# Patient Record
Sex: Female | Born: 1935 | Race: Black or African American | Hispanic: No | Marital: Married | Smoking: Never smoker
Health system: Southern US, Community
[De-identification: ages and names within clinical notes are randomized; demographics above are authoritative.]

## PROBLEM LIST (undated history)

## (undated) DIAGNOSIS — D649 Anemia, unspecified: Secondary | ICD-10-CM

## (undated) DIAGNOSIS — I509 Heart failure, unspecified: Secondary | ICD-10-CM

## (undated) DIAGNOSIS — I350 Nonrheumatic aortic (valve) stenosis: Secondary | ICD-10-CM

## (undated) DIAGNOSIS — N200 Calculus of kidney: Secondary | ICD-10-CM

## (undated) DIAGNOSIS — E876 Hypokalemia: Secondary | ICD-10-CM

## (undated) HISTORY — DX: Nonrheumatic aortic (valve) stenosis: I35.0

## (undated) HISTORY — DX: Calculus of kidney: N20.0

## (undated) HISTORY — DX: Hypokalemia: E87.6

## (undated) HISTORY — DX: Anemia, unspecified: D64.9

## (undated) HISTORY — DX: Heart failure, unspecified: I50.9

---

## 2017-08-21 ENCOUNTER — Ambulatory Visit
Admission: RE | Admit: 2017-08-21 | Discharge: 2017-08-21 | Disposition: A | Discharge status: Home / Self Care | Payer: Medicare Other | Source: Ambulatory Visit | Attending: Internal Medicine | Admitting: Internal Medicine

## 2017-08-21 ENCOUNTER — Other Ambulatory Visit: Payer: Self-pay | Admitting: Internal Medicine

## 2017-08-21 DIAGNOSIS — R0600 Dyspnea, unspecified: Secondary | ICD-10-CM

## 2017-08-21 DIAGNOSIS — J90 Pleural effusion, not elsewhere classified: Secondary | ICD-10-CM

## 2017-08-22 ENCOUNTER — Emergency Department: Payer: Medicare Other

## 2017-08-22 ENCOUNTER — Inpatient Hospital Stay
Admission: EM | Admit: 2017-08-22 | Discharge: 2017-08-26 | DRG: 292 | Disposition: A | Discharge status: Home Health | Payer: Medicare Other | Attending: Specialist | Admitting: Specialist

## 2017-08-22 ENCOUNTER — Other Ambulatory Visit: Payer: Self-pay

## 2017-08-22 DIAGNOSIS — I509 Heart failure, unspecified: Secondary | ICD-10-CM | POA: Diagnosis not present

## 2017-08-22 DIAGNOSIS — E876 Hypokalemia: Secondary | ICD-10-CM | POA: Diagnosis present

## 2017-08-22 DIAGNOSIS — I248 Other forms of acute ischemic heart disease: Secondary | ICD-10-CM | POA: Diagnosis present

## 2017-08-22 DIAGNOSIS — H9190 Unspecified hearing loss, unspecified ear: Secondary | ICD-10-CM | POA: Diagnosis present

## 2017-08-22 DIAGNOSIS — I493 Ventricular premature depolarization: Secondary | ICD-10-CM | POA: Diagnosis present

## 2017-08-22 DIAGNOSIS — Z66 Do not resuscitate: Secondary | ICD-10-CM | POA: Diagnosis present

## 2017-08-22 DIAGNOSIS — I959 Hypotension, unspecified: Secondary | ICD-10-CM | POA: Diagnosis present

## 2017-08-22 DIAGNOSIS — I350 Nonrheumatic aortic (valve) stenosis: Secondary | ICD-10-CM | POA: Diagnosis present

## 2017-08-22 DIAGNOSIS — E440 Moderate protein-calorie malnutrition: Secondary | ICD-10-CM | POA: Diagnosis present

## 2017-08-22 DIAGNOSIS — R17 Unspecified jaundice: Secondary | ICD-10-CM | POA: Diagnosis present

## 2017-08-22 DIAGNOSIS — R2681 Unsteadiness on feet: Secondary | ICD-10-CM | POA: Diagnosis present

## 2017-08-22 DIAGNOSIS — I5043 Acute on chronic combined systolic (congestive) and diastolic (congestive) heart failure: Secondary | ICD-10-CM | POA: Diagnosis not present

## 2017-08-22 DIAGNOSIS — I5031 Acute diastolic (congestive) heart failure: Secondary | ICD-10-CM | POA: Diagnosis not present

## 2017-08-22 DIAGNOSIS — R011 Cardiac murmur, unspecified: Secondary | ICD-10-CM | POA: Diagnosis present

## 2017-08-22 DIAGNOSIS — I471 Supraventricular tachycardia, unspecified: Secondary | ICD-10-CM

## 2017-08-22 DIAGNOSIS — Z8249 Family history of ischemic heart disease and other diseases of the circulatory system: Secondary | ICD-10-CM

## 2017-08-22 DIAGNOSIS — Z6824 Body mass index (BMI) 24.0-24.9, adult: Secondary | ICD-10-CM

## 2017-08-22 DIAGNOSIS — Z9181 History of falling: Secondary | ICD-10-CM | POA: Diagnosis not present

## 2017-08-22 DIAGNOSIS — I5020 Unspecified systolic (congestive) heart failure: Secondary | ICD-10-CM

## 2017-08-22 DIAGNOSIS — R0600 Dyspnea, unspecified: Secondary | ICD-10-CM | POA: Diagnosis present

## 2017-08-22 DIAGNOSIS — D649 Anemia, unspecified: Secondary | ICD-10-CM

## 2017-08-22 DIAGNOSIS — J90 Pleural effusion, not elsewhere classified: Secondary | ICD-10-CM | POA: Diagnosis present

## 2017-08-22 DIAGNOSIS — I5021 Acute systolic (congestive) heart failure: Secondary | ICD-10-CM | POA: Diagnosis not present

## 2017-08-22 DIAGNOSIS — D638 Anemia in other chronic diseases classified elsewhere: Secondary | ICD-10-CM | POA: Diagnosis present

## 2017-08-22 LAB — HEPATIC FUNCTION PANEL
ALT: 11 U/L — ABNORMAL LOW (ref 14–54)
AST: 29 U/L (ref 15–41)
Albumin: 3.4 g/dL — ABNORMAL LOW (ref 3.5–5.0)
Alkaline Phosphatase: 49 U/L (ref 38–126)
BILIRUBIN INDIRECT: 2 mg/dL — AB (ref 0.3–0.9)
BILIRUBIN TOTAL: 2.7 mg/dL — AB (ref 0.3–1.2)
Bilirubin, Direct: 0.7 mg/dL — ABNORMAL HIGH (ref 0.1–0.5)
Total Protein: 6 g/dL — ABNORMAL LOW (ref 6.5–8.1)

## 2017-08-22 LAB — BASIC METABOLIC PANEL
Anion gap: 8 (ref 5–15)
BUN: 19 mg/dL (ref 6–20)
CALCIUM: 8.9 mg/dL (ref 8.9–10.3)
CO2: 23 mmol/L (ref 22–32)
CREATININE: 0.79 mg/dL (ref 0.44–1.00)
Chloride: 113 mmol/L — ABNORMAL HIGH (ref 101–111)
GFR calc non Af Amer: >60 mL/min (ref >60)
GLUCOSE: 115 mg/dL — AB (ref 65–99)
Potassium: 3.7 mmol/L (ref 3.5–5.1)
Sodium: 144 mmol/L (ref 135–145)

## 2017-08-22 LAB — CBC
HCT: 27.4 % — ABNORMAL LOW (ref 35.0–47.0)
Hemoglobin: 8.9 g/dL — ABNORMAL LOW (ref 12.0–16.0)
MCH: 30.6 pg (ref 26.0–34.0)
MCHC: 32.3 g/dL (ref 32.0–36.0)
MCV: 94.7 fL (ref 80.0–100.0)
PLATELETS: 244 10*3/uL (ref 150–440)
RBC: 2.89 MIL/uL — ABNORMAL LOW (ref 3.80–5.20)
RDW: 21 % — ABNORMAL HIGH (ref 11.5–14.5)
WBC: 3.7 10*3/uL (ref 3.6–11.0)

## 2017-08-22 LAB — TSH: TSH: 3.899 u[IU]/mL (ref 0.350–4.500)

## 2017-08-22 LAB — TROPONIN I: TROPONIN I: 0.03 ng/mL — AB (ref <0.03)

## 2017-08-22 LAB — BRAIN NATRIURETIC PEPTIDE: B NATRIURETIC PEPTIDE 5: 3354 pg/mL — AB (ref 0.0–100.0)

## 2017-08-22 MED ORDER — SODIUM CHLORIDE 0.9% FLUSH: 3.0000 mL | INTRAVENOUS | Status: DC | PRN

## 2017-08-22 MED ORDER — SODIUM CHLORIDE 0.9% FLUSH
3.0000 mL | Freq: Two times a day (BID) | INTRAVENOUS | Status: DC
  Administered 2017-08-22 – 2017-08-26 (×9): 3 mL via INTRAVENOUS

## 2017-08-22 MED ORDER — FUROSEMIDE 10 MG/ML IJ SOLN
20.0000 mg | Freq: Once | INTRAMUSCULAR | Status: AC
  Administered 2017-08-22: 20 mg via INTRAVENOUS
  Filled 2017-08-22: qty 4

## 2017-08-22 MED ORDER — ACETAMINOPHEN 325 MG PO TABS
650.0000 mg | ORAL_TABLET | Freq: Four times a day (QID) | ORAL | Status: DC | PRN
  Administered 2017-08-24 – 2017-08-25 (×2): 650 mg via ORAL
  Filled 2017-08-22 (×2): qty 2

## 2017-08-22 MED ORDER — ONDANSETRON HCL 4 MG/2ML IJ SOLN: 4.0000 mg | Freq: Four times a day (QID) | INTRAMUSCULAR | Status: DC | PRN

## 2017-08-22 MED ORDER — ONDANSETRON HCL 4 MG PO TABS: 4.0000 mg | ORAL_TABLET | Freq: Four times a day (QID) | ORAL | Status: DC | PRN

## 2017-08-22 MED ORDER — FUROSEMIDE 10 MG/ML IJ SOLN: 40.0000 mg | Freq: Two times a day (BID) | INTRAMUSCULAR | Status: DC

## 2017-08-22 MED ORDER — FUROSEMIDE 10 MG/ML IJ SOLN
40.0000 mg | Freq: Two times a day (BID) | INTRAMUSCULAR | Status: DC
  Administered 2017-08-22 – 2017-08-25 (×6): 40 mg via INTRAVENOUS
  Filled 2017-08-22 (×5): qty 4

## 2017-08-22 MED ORDER — ENOXAPARIN SODIUM 40 MG/0.4ML ~~LOC~~ SOLN
40.0000 mg | SUBCUTANEOUS | Status: DC
  Administered 2017-08-22 – 2017-08-25 (×3): 40 mg via SUBCUTANEOUS
  Filled 2017-08-22 (×3): qty 0.4

## 2017-08-22 MED ORDER — ACETAMINOPHEN 650 MG RE SUPP: 650.0000 mg | Freq: Four times a day (QID) | RECTAL | Status: DC | PRN

## 2017-08-22 NOTE — Consult Note (Addendum)
Cardiology Consultation:   Patient ID: Carolyn Clarke; [id number]; [birth year]   Admit date: 08/22/2017 Date of Consult: 08/22/2017  Primary Care Provider: Jaclyn Shaggyate, Denny C, MD Primary Cardiologist[facility] Consult requested by Dr. Nemiah CommanderKalisetti  reason for consult: CHF, leg edema, shortness of breath    Patient Profile:   Carolyn Clarke is a 84 y.o. female with a hx of anemia, no doctor follow-up in 30 years, presenting with worsening shortness of breath, leg edema, general malaise, weakness, cough  History of Present Illness:   Carolyn Clarke reports several weeks of weakness, shortness of breath on exertion, coughing,worsening lower extremity edema extending up to her abdomen  Micah FlesherWent to see primary care physician who referred her to the hospital Chest x-ray reviewed personally by myself showing bilateral pleural effusions with pulmonary vascular congestion  Lab work reviewed with anemia   History reviewed. No pertinent past medical history.  Reports she has not seen a doctor in 30 years  History reviewed. No pertinent surgical history.   Home Medications:  Prior to Admission medications   Not on File    Inpatient Medications: Scheduled Meds: . enoxaparin (LOVENOX) injection  40 mg Subcutaneous Q24H  . furosemide  40 mg Intravenous BID  . sodium chloride flush  3 mL Intravenous Q12H   Continuous Infusions:  PRN Meds: acetaminophen **OR** acetaminophen, ondansetron **OR** ondansetron (ZOFRAN) IV, sodium chloride flush  Allergies:   No Known Allergies  Social History:   Social History   Socioeconomic History  . Marital status: Married    Spouse name: Not on file  . Number of children: Not on file  . Years of education: Not on file  . Highest education level: Not on file  Occupational History  . Not on file  Social Needs  . Financial resource strain: Not on file  . Food insecurity:    Worry: Not on file    Inability: Not on file  . Transportation needs:   Medical: Not on file    Non-medical: Not on file  Tobacco Use  . Smoking status: Never Smoker  . Smokeless tobacco: Never Used  Substance and Sexual Activity  . Alcohol use: Never    Frequency: Never  . Drug use: Never  . Sexual activity: Not Currently  Lifestyle  . Physical activity:    Days per week: Not on file    Minutes per session: Not on file  . Stress: Not on file  Relationships  . Social connections:    Talks on phone: Not on file    Gets together: Not on file    Attends religious service: Not on file    Active member of club or organization: Not on file    Attends meetings of clubs or organizations: Not on file    Relationship status: Not on file  . Intimate partner violence:    Fear of current or ex partner: Not on file    Emotionally abused: Not on file    Physically abused: Not on file    Forced sexual activity: Not on file  Other Topics Concern  . Not on file  Social History Narrative   Lives at home with husband. Ambulates with a cane. Independent at baseline    Family History:    Family History  Problem Relation Age of Onset  . Heart failure Mother   . Hypertension Father      ROS:  Please see the history of present illness.  Review of Systems  Constitutional: Positive for malaise/fatigue.  Respiratory: Positive for cough and shortness of breath.   Cardiovascular: Positive for leg swelling.  Gastrointestinal: Negative.   Musculoskeletal: Negative.   Neurological: Negative.   Psychiatric/Behavioral: Negative.   All other systems reviewed and are negative.   Physical Exam/Data:   Vitals:   08/22/17 1200 08/22/17 1215 08/22/17 1400 08/22/17 1525  BP: 132/69   (!) 149/58  Pulse: 71 65 72 68  Resp: 18 19  20   Temp:    (!) 97.4 F (36.3 C)  TempSrc:    (P) Axillary  SpO2: 97% 100% 98% 100%  Weight:    153 lb (69.4 kg)  Height:    5\' 6"  (1.676 m)    Intake/Output Summary (Last 24 hours) at 08/22/2017 1924 Last data filed at 08/22/2017  1850 Gross per 24 hour  Intake 240 ml  Output 150 ml  Net 90 ml   Filed Weights   08/22/17 1035 08/22/17 1525  Weight: 162 lb (73.5 kg) 153 lb (69.4 kg)   Body mass index is 24.69 kg/m.  General:  Well nourished, well developed, in no acute distress, walks with a cane HEENT: normal Lymph: no adenopathy Neck: no JVD Endocrine:  No thryomegaly Vascular: No carotid bruits; FA pulses 2+ bilaterally without bruits  Cardiac:  normal S1, S2; RRR; no murmur  Lungs:  clear to auscultation bilaterally, dullness at the bases bilaterally,  no wheezing, rhonchi or rales  Abd: soft, nontender, no hepatomegaly  Ext: 1+ LE edema  Musculoskeletal:  No deformities, BUE and BLE strength normal and equal Skin: warm and dry  Neuro:  CNs 2-12 intact, no focal abnormalities noted Psych:  Normal affect   EKG:  The EKG was personally reviewed and demonstrates:   EKG not currently available We have contacted the emergency room to locate the EKG, not scanned in our system  Telemetry:  Telemetry was personally reviewed and demonstrates:   Normal sinus rhythm PVCs,  One run 12 beat SVT 6:56 PM  Relevant CV Studies: Echo pending  Laboratory Data:  Chemistry Recent Labs  Lab 08/22/17 1104  NA 144  K 3.7  CL 113*  CO2 23  GLUCOSE 115*  BUN 19  CREATININE 0.79  CALCIUM 8.9  GFRNONAA >60  GFRAA >60  ANIONGAP 8    Recent Labs  Lab 08/22/17 1307  PROT 6.0*  ALBUMIN 3.4*  AST 29  ALT 11*  ALKPHOS 49  BILITOT 2.7*   Hematology Recent Labs  Lab 08/22/17 1104  WBC 3.7  RBC 2.89*  HGB 8.9*  HCT 27.4*  MCV 94.7  MCH 30.6  MCHC 32.3  RDW 21.0*  PLT 244   Cardiac Enzymes Recent Labs  Lab 08/22/17 1104  TROPONINI 0.03*   No results for input(s): TROPIPOC in the last 168 hours.  BNP Recent Labs  Lab 08/22/17 1307  BNP 3,354.0*    DDimer No results for input(s): DDIMER in the last 168 hours.  Radiology/Studies:  Dg Chest 2 View  Result Date: 08/22/2017 CLINICAL DATA:   Shortness of breath and lower extremity swelling. EXAM: CHEST - 2 VIEW COMPARISON:  Chest x-ray from yesterday. FINDINGS: The cardiac silhouette is obscured. Pulmonary vascular congestion with moderate right greater than left pleural effusions, unchanged. Associated bibasilar atelectasis. No pneumothorax. No acute osseous abnormality. IMPRESSION: Unchanged moderate bilateral pleural effusions with lower lobe atelectasis. Electronically Signed   By: Obie Dredge M.D.   On: 08/22/2017 11:37   Dg Chest 2 View  Result Date: 08/21/2017 CLINICAL DATA:  Shortness of  breath. EXAM: CHEST - 2 VIEW COMPARISON:  None. FINDINGS: Large bilateral pleural effusions are noted with probable underlying atelectasis or edema. No pneumothorax is noted. Bony thorax is unremarkable. IMPRESSION: Large bilateral pleural effusions are noted with probable underlying atelectasis or edema. Electronically Signed   By: Lupita Raider, M.D.   On: 08/21/2017 16:26    Assessment and Plan:   1. CHF with symptoms of shortness of breath, leg swelling Echocardiogram pending to determine ejection fraction Symptoms for the past several weeks Markedly elevated BNP 3300 Leg swelling, weight gain, orthopnea and PND, shortness of breath Likely exacerbated by anemia, aortic valve stenosis --Currently on Lasix IV twice daily  2) anemia Hematocrit 27 Needs anemia work-up, iron studies Likely contributing to her symptoms detailed above Consider stool guaiac  3) unsteady gait Walks with a cane, fall risk  4) weakness In the setting of 1 and 2 as detailed above  5) elevated bilirubin Etiology unclear, consider right upper quadrant ultrasound  6) hypokalemia Would aggressively replete  7) SVT We will need to monitor Telemetry closely 12 beat run noted at 6:56 PM Also with PVCs  8) Aortic valve stenosis Likely severe , on exam Echo pending May need diuresis,  If severe, cath, TAVR    Total encounter time more than  110 minutes  Greater than 50% was spent in counseling and coordination of care with the patient   For questions or updates, please contact [facility] Please consult [email or web address] for contact info under Cardiology/STEMI.   Signed, Julien Nordmann, MD  08/22/2017 7:24 PM

## 2017-08-22 NOTE — H&P (Signed)
[facility] [place] [facility]   PATIENT NAME: Carolyn Clarke    MR#:  [id number]  DATE OF BIRTH:  Mar 15, 1927  DATE OF ADMISSION:  08/22/2017  PRIMARY CARE PHYSICIAN: Jaclyn Shaggyate, Denny C, MD   REQUESTING/REFERRING PHYSICIAN: Dr. Dorothea GlassmanPaul Malinda  CHIEF COMPLAINT:   Chief Complaint  Patient presents with  . Shortness of Breath    HISTORY OF PRESENT ILLNESS:  Carolyn CatholicKairo Mcmahen  is a 84 y.o. female with no significant past medical history presents to hospital secondary to worsening lower extremity edema and shortness of breath. Patient states she has not seen a doctor in almost 30 years.  She usually uses home remedies for simple complaints like common cold or abdominal pain.  She is independent, ambulates with a cane and has been caring for her husband who is [id number] years old.  For the last couple of weeks she has noticed that she was getting weak, easily dyspneic on minimal exertion and dry cough.  She has noticed increasing swelling of both her legs all the way to her abdomen.  She went to see her primary care doctor today, who has referred her to come to the hospital.  Chest x-ray with bilateral pleural effusions and pulmonary vascular congestion.  PAST MEDICAL HISTORY:  History reviewed. No pertinent past medical history.  PAST SURGICAL HISTORY:  History reviewed. No pertinent surgical history.  SOCIAL HISTORY:   Social History   Tobacco Use  . Smoking status: Never Smoker  . Smokeless tobacco: Never Used  Substance Use Topics  . Alcohol use: Never    Frequency: Never    FAMILY HISTORY:   Family History  Problem Relation Age of Onset  . Heart failure Mother   . Hypertension Father     DRUG ALLERGIES:  No Known Allergies  REVIEW OF SYSTEMS:   Review of Systems  Constitutional: Positive for malaise/fatigue. Negative for chills, fever and weight loss.  HENT: Negative for ear discharge, ear pain, hearing loss, nosebleeds and tinnitus.   Eyes: Negative  for blurred vision, double vision and photophobia.  Respiratory: Positive for cough and shortness of breath. Negative for hemoptysis and wheezing.   Cardiovascular: Positive for leg swelling. Negative for chest pain, palpitations and orthopnea.  Gastrointestinal: Negative for abdominal pain, constipation, diarrhea, heartburn, melena, nausea and vomiting.  Genitourinary: Negative for dysuria, frequency, hematuria and urgency.  Musculoskeletal: Negative for back pain, myalgias and neck pain.  Skin: Negative for rash.  Neurological: Negative for dizziness, tingling, tremors, sensory change, speech change, focal weakness and headaches.  Endo/Heme/Allergies: Does not bruise/bleed easily.  Psychiatric/Behavioral: Negative for depression.    MEDICATIONS AT HOME:   Prior to Admission medications   Not on File      VITAL SIGNS:  Blood pressure 132/69, pulse 72, temperature 97.7 F (36.5 C), temperature source Oral, resp. rate 19, height 5\' 2"  (1.575 m), weight 73.5 kg (162 lb), SpO2 98 %.  PHYSICAL EXAMINATION:   Physical Exam  GENERAL:  84 y.o.-year-old elderly patient lying in the bed with no acute distress.  EYES: Pupils equal, round, reactive to light and accommodation. No scleral icterus. Extraocular muscles intact.  HEENT: Head atraumatic, normocephalic. Oropharynx and nasopharynx clear.  NECK:  Supple, no jugular venous distention. No thyroid enlargement, no tenderness.  LUNGS: Normal breath sounds bilaterally, no wheezing, rales,rhonchi or crepitation. No use of accessory muscles of respiration.  Decreased bibasilar breath sounds CARDIOVASCULAR: S1, S2 normal. No  rubs, or gallops.  Loud 3/6 systolic murmur is present  ABDOMEN: Soft, nontender, nondistended. Bowel sounds present. No organomegaly or mass.  EXTREMITIES: 3+ bilateral pitting edema.  No cyanosis, or clubbing.  NEUROLOGIC: Cranial nerves II through XII are intact. Muscle strength 5/5 in all extremities. Sensation intact.  Gait not checked.  Global weakness noted PSYCHIATRIC: The patient is alert and oriented x 3.  SKIN: No obvious rash, lesion, or ulcer.   LABORATORY PANEL:   CBC Recent Labs  Lab 08/22/17 1104  WBC 3.7  HGB 8.9*  HCT 27.4*  PLT 244   ------------------------------------------------------------------------------------------------------------------  Chemistries  Recent Labs  Lab 08/22/17 1104 08/22/17 1307  NA 144  --   K 3.7  --   CL 113*  --   CO2 23  --   GLUCOSE 115*  --   BUN 19  --   CREATININE 0.79  --   CALCIUM 8.9  --   AST  --  29  ALT  --  11*  ALKPHOS  --  49  BILITOT  --  2.7*   ------------------------------------------------------------------------------------------------------------------  Cardiac Enzymes Recent Labs  Lab 08/22/17 1104  TROPONINI 0.03*   ------------------------------------------------------------------------------------------------------------------  RADIOLOGY:  Dg Chest 2 View  Result Date: 08/22/2017 CLINICAL DATA:  Shortness of breath and lower extremity swelling. EXAM: CHEST - 2 VIEW COMPARISON:  Chest x-ray from yesterday. FINDINGS: The cardiac silhouette is obscured. Pulmonary vascular congestion with moderate right greater than left pleural effusions, unchanged. Associated bibasilar atelectasis. No pneumothorax. No acute osseous abnormality. IMPRESSION: Unchanged moderate bilateral pleural effusions with lower lobe atelectasis. Electronically Signed   By: Obie Dredge M.D.   On: 08/22/2017 11:37   Dg Chest 2 View  Result Date: 08/21/2017 CLINICAL DATA:  Shortness of breath. EXAM: CHEST - 2 VIEW COMPARISON:  None. FINDINGS: Large bilateral pleural effusions are noted with probable underlying atelectasis or edema. No pneumothorax is noted. Bony thorax is unremarkable. IMPRESSION: Large bilateral pleural effusions are noted with probable underlying atelectasis or edema. Electronically Signed   By: Lupita Raider, M.D.   On:  08/21/2017 16:26    EKG:   Orders placed or performed during the hospital encounter of 08/22/17  . ED EKG within 10 minutes  . ED EKG within 10 minutes    IMPRESSION AND PLAN:   Torina Ey  is a 84 y.o. female with no significant past medical history presents to hospital secondary to worsening lower extremity edema and shortness of breath.  1.  Acute CHF-we will admit to telemetry, significantly elevated BNP -Started on IV Lasix -Echocardiogram, cardiology consult -Based on blood pressure and ejection fraction, will adjust other medications.  2.  Anemia-likely anemia of chronic disease.  Continue to monitor.  No acute indication for transfusion  3.  Weakness-could be from CHF and cardiomyopathy.  Check echo -Check urine analysis to rule out infection -Physical therapy consult  4.  DVT prophylaxis-Lovenox   All the records are reviewed and case discussed with ED provider. Management plans discussed with the patient, family and they are in agreement.  CODE STATUS: DNR  TOTAL TIME TAKING CARE OF THIS PATIENT: 50 minutes.    Enid Baas M.D on 08/22/2017 at 3:00 PM  Between 7am to 6pm - Pager - [phone]  After 6pm go to [email or web address] - Social research officer, government  [facility]  Office  [phone]  CC: Primary care physician; Jaclyn Shaggy, MD

## 2017-08-22 NOTE — Progress Notes (Signed)
   [facility] [place] [facility]   Advance care planning  Hospital Day: 0 days Malena CatholicKairo Starling is a 84 y.o. female presenting with Shortness of Breath .   Advance care planning discussed with patient and her god daughter at bedside. All questions in regards to overall condition and expected prognosis answered.  Patient is completely alert and oriented.  She has clearly expressed that if her heart were to stop, she wants to Pass away naturally.  We will change her CODE STATUS to DNR.  CODE STATUS: DNR  Time spent: 18 minutes

## 2017-08-22 NOTE — ED Provider Notes (Signed)
[facility] Emergency Department Provider Note   ____________________________________________   First MD Initiated Contact with Patient 08/22/17 1132     (approximate)  I have reviewed the triage vital signs and the nursing notes.   HISTORY  Chief Complaint Shortness of Breath   HPI Carolyn Clarke is a 84 y.o. female patient has not been to see the doctor for years went to see Dr. Arlana Pouchate yesterday was called back today and sent here because of heart failure.  She says she is been short of breath.  She is been having bilateral leg swelling for the last 2 to 3 months.  She denies any other medical problems except for swelling in her abdomen as well.   History reviewed. No pertinent past medical history.  There are no active problems to display for this patient.   History reviewed. No pertinent surgical history.  Prior to Admission medications   Not on File    Allergies Patient has no known allergies.  No family history on file.  Social History Social History   Tobacco Use  . Smoking status: Never Smoker  . Smokeless tobacco: Never Used  Substance Use Topics  . Alcohol use: Never    Frequency: Never  . Drug use: Never    Review of Systems  Constitutional: No fever/chills Eyes: No visual changes. ENT: No sore throat. Cardiovascular: Denies chest pain. Respiratory:  shortness of breath. Gastrointestinal: No abdominal pain.  No nausea, no vomiting.  No diarrhea.  No constipation. Genitourinary: Negative for dysuria. Musculoskeletal: Negative for back pain. Skin: Negative for rash. Neurological: Negative for headaches, focal weakness or   ____________________________________________   PHYSICAL EXAM:  VITAL SIGNS: ED Triage Vitals [08/22/17 1035]  Enc Vitals Group     BP (!) 123/55     Pulse Rate 73     Resp 16     Temp 97.7 F (36.5 C)     Temp Source Oral     SpO2 100 %     Weight 162 lb (73.5 kg)     Height 5\' 2"  (1.575  m)     Head Circumference      Peak Flow      Pain Score 0     Pain Loc      Pain Edu?      Excl. in GC?     Constitutional: Alert and oriented. Well appearing and in no acute distress. Eyes: Conjunctivae are normal.  Head: Atraumatic. Nose: No congestion/rhinnorhea. Mouth/Throat: Mucous membranes are moist.  Oropharynx non-erythematous. Neck: No stridor.  Cardiovascular: Normal rate, regular rhythm. Grossly normal heart sounds.  Good peripheral circulation. Respiratory: Normal respiratory effort.  No retractions. Lungs CTAB in upper lung feels fairly quiet in the lower lung fields. Gastrointestinal: Soft and nontender. No distention. No abdominal bruits. No CVA tenderness. Musculoskeletal: No lower extremity tenderness edema to above the knees edema.   Neurologic:  Normal speech and language. No gross focal neurologic deficits are appreciated.  Skin:  Skin is warm, dry and intact. No rash noted. Psychiatric: Mood and affect are normal. Speech and behavior are normal.  ____________________________________________   LABS (all labs ordered are listed, but only abnormal results are displayed)  Labs Reviewed  BASIC METABOLIC PANEL - Abnormal; Notable for the following components:      Result Value   Chloride 113 (*)    Glucose, Bld 115 (*)    All other components within normal limits  CBC - Abnormal; Notable for the following components:  RBC 2.89 (*)    Hemoglobin 8.9 (*)    HCT 27.4 (*)    RDW 21.0 (*)    All other components within normal limits  TROPONIN I - Abnormal; Notable for the following components:   Troponin I 0.03 (*)    All other components within normal limits  BRAIN NATRIURETIC PEPTIDE - Abnormal; Notable for the following components:   B Natriuretic Peptide 3,354.0 (*)    All other components within normal limits  HEPATIC FUNCTION PANEL - Abnormal; Notable for the following components:   Total Protein 6.0 (*)    Albumin 3.4 (*)    ALT 11 (*)    Total  Bilirubin 2.7 (*)    Bilirubin, Direct 0.7 (*)    Indirect Bilirubin 2.0 (*)    All other components within normal limits   ____________________________________________  EKG   ____________________________________________  RADIOLOGY  ED MD interpretation: Bilateral effusions and increased vascular markings in the lungs consistent with congestive failure reviewed the film  Official radiology report(s): Dg Chest 2 View  Result Date: 08/22/2017 CLINICAL DATA:  Shortness of breath and lower extremity swelling. EXAM: CHEST - 2 VIEW COMPARISON:  Chest x-ray from yesterday. FINDINGS: The cardiac silhouette is obscured. Pulmonary vascular congestion with moderate right greater than left pleural effusions, unchanged. Associated bibasilar atelectasis. No pneumothorax. No acute osseous abnormality. IMPRESSION: Unchanged moderate bilateral pleural effusions with lower lobe atelectasis. Electronically Signed   By: Obie Dredge M.D.   On: 08/22/2017 11:37    ____________________________________________   PROCEDURES  Procedure(s) performed:   Procedures  Critical Care performed:   ____________________________________________   INITIAL IMPRESSION / ASSESSMENT AND PLAN / ED COURSE Patient with new onset congestive heart failure.  She has large effusions gets short of breath when she walks although her sats only dropped down to 91%.  Troponin is bumped as well.  I will see if we can get her in the hospital to expedite her evaluation and treatment since it is new onset congestive heart failure     Clinical Course as of Aug 22 1344  Thu Aug 22, 2017  1306 Brain natriuretic peptide [PM]    Clinical Course User Index [PM] Arnaldo Natal, MD     ____________________________________________   FINAL CLINICAL IMPRESSION(S) / ED DIAGNOSES  Final diagnoses:  Systolic congestive heart failure, unspecified HF chronicity [facility])     ED Discharge Orders    None       Note:  This  document was prepared using Dragon voice recognition software and may include unintentional dictation errors.    Arnaldo Natal, MD 08/22/17 [phone]

## 2017-08-22 NOTE — ED Triage Notes (Addendum)
Pt states she was seen by her PCP Dr. Ala Bentate  Yesterday and was called back today and referred to the ED due to heart failure. States she has been SOB. Denies any pain, having BL LE swelling.. States she has not been to see a doctor since 1988

## 2017-08-22 NOTE — ED Notes (Signed)
CNA students ambulated pt. Pt was able to walk with her cane and dropped to 91% on RA with ambulation.

## 2017-08-22 NOTE — ED Notes (Signed)
Admitting doctor at bedside. Consult placed of PIV insert due to pt having 3 attempts for blood draw in triage and then two PIV attempts by this nurse.

## 2017-08-23 ENCOUNTER — Inpatient Hospital Stay (HOSPITAL_COMMUNITY)
Admit: 2017-08-23 | Discharge: 2017-08-23 | Disposition: A | Discharge status: Home / Self Care | Payer: Medicare Other | Attending: Internal Medicine | Admitting: Internal Medicine

## 2017-08-23 DIAGNOSIS — E440 Moderate protein-calorie malnutrition: Secondary | ICD-10-CM

## 2017-08-23 DIAGNOSIS — I5021 Acute systolic (congestive) heart failure: Secondary | ICD-10-CM

## 2017-08-23 LAB — BASIC METABOLIC PANEL
Anion gap: 6 (ref 5–15)
BUN: 19 mg/dL (ref 6–20)
CO2: 28 mmol/L (ref 22–32)
Calcium: 8.6 mg/dL — ABNORMAL LOW (ref 8.9–10.3)
Chloride: 108 mmol/L (ref 101–111)
Creatinine, Ser: 0.84 mg/dL (ref 0.44–1.00)
GFR calc Af Amer: >60 mL/min (ref >60)
GFR, EST NON AFRICAN AMERICAN: 60 mL/min — AB (ref >60)
GLUCOSE: 137 mg/dL — AB (ref 65–99)
POTASSIUM: 3.3 mmol/L — AB (ref 3.5–5.1)
Sodium: 142 mmol/L (ref 135–145)

## 2017-08-23 LAB — CBC
HEMATOCRIT: 29.7 % — AB (ref 35.0–47.0)
Hemoglobin: 9.8 g/dL — ABNORMAL LOW (ref 12.0–16.0)
MCH: 30.9 pg (ref 26.0–34.0)
MCHC: 33 g/dL (ref 32.0–36.0)
MCV: 93.5 fL (ref 80.0–100.0)
PLATELETS: 277 10*3/uL (ref 150–440)
RBC: 3.17 MIL/uL — ABNORMAL LOW (ref 3.80–5.20)
RDW: 18 % — ABNORMAL HIGH (ref 11.5–14.5)
WBC: 6 10*3/uL (ref 3.6–11.0)

## 2017-08-23 LAB — IRON AND TIBC
IRON: 74 ug/dL (ref 28–170)
SATURATION RATIOS: 39 % — AB (ref 10.4–31.8)
TIBC: 192 ug/dL — AB (ref 250–450)
UIBC: 118 ug/dL

## 2017-08-23 LAB — ECHOCARDIOGRAM COMPLETE
Height: 66 in
Weight: 2400 oz

## 2017-08-23 LAB — RETICULOCYTES
RBC.: 3.13 MIL/uL — ABNORMAL LOW (ref 3.80–5.20)
Retic Count, Absolute: 81.4 10*3/uL (ref 19.0–183.0)
Retic Ct Pct: 2.6 % (ref 0.4–3.1)

## 2017-08-23 LAB — FOLATE: FOLATE: 19.7 ng/mL (ref >5.9)

## 2017-08-23 LAB — FERRITIN: Ferritin: 381 ng/mL — ABNORMAL HIGH (ref 11–307)

## 2017-08-23 LAB — VITAMIN B12: VITAMIN B 12: 353 pg/mL (ref 180–914)

## 2017-08-23 MED ORDER — ENSURE ENLIVE PO LIQD
237.0000 mL | Freq: Two times a day (BID) | ORAL | Status: DC
  Administered 2017-08-23 – 2017-08-26 (×6): 237 mL via ORAL

## 2017-08-23 MED ORDER — POTASSIUM CHLORIDE CRYS ER 20 MEQ PO TBCR
20.0000 meq | EXTENDED_RELEASE_TABLET | Freq: Two times a day (BID) | ORAL | Status: DC
  Administered 2017-08-23 – 2017-08-25 (×5): 20 meq via ORAL
  Filled 2017-08-23 (×5): qty 1

## 2017-08-23 NOTE — Progress Notes (Signed)
[employer] - [place] at [facility]   PATIENT NAME: Carolyn Clarke    MR#:  [id number]  DATE OF BIRTH:  Jan 26, 1927  SUBJECTIVE:   She is here due to shortness of breath and lower extremity edema and noted to be a new onset CHF.  Shortness of breath somewhat improved since yesterday, and her lower extremity edema has also improved.  Echocardiogram showing mild systolic dysfunction with EF of 45-50% but she was noted to have critical aortic stenosis.  REVIEW OF SYSTEMS:    Review of Systems  Constitutional: Negative for chills and fever.  HENT: Negative for congestion and tinnitus.   Eyes: Negative for blurred vision and double vision.  Respiratory: Positive for shortness of breath. Negative for cough and wheezing.   Cardiovascular: Positive for leg swelling. Negative for chest pain, orthopnea and PND.  Gastrointestinal: Negative for abdominal pain, diarrhea, nausea and vomiting.  Genitourinary: Negative for dysuria and hematuria.  Neurological: Negative for dizziness, sensory change and focal weakness.  All other systems reviewed and are negative.   Nutrition: Heart Healthy Tolerating Diet: Yes Tolerating PT: Await Eval.   DRUG ALLERGIES:  No Known Allergies  VITALS:  Blood pressure 133/62, pulse 74, temperature 98.1 F (36.7 C), temperature source Oral, resp. rate 18, height 5\' 6"  (1.676 m), weight 68 kg (150 lb), SpO2 100 %.  PHYSICAL EXAMINATION:   Physical Exam  GENERAL:  84 y.o.-year-old patient lying in bed in no acute distress.  EYES: Pupils equal, round, reactive to light and accommodation. No scleral icterus. Extraocular muscles intact.  HEENT: Head atraumatic, normocephalic. Oropharynx and nasopharynx clear.  NECK:  Supple, no jugular venous distention. No thyroid enlargement, no tenderness.  LUNGS: Normal breath sounds bilaterally, no wheezing, bibasilar rales, rhonchi. No use of accessory muscles of respiration.  CARDIOVASCULAR: S1, S2 normal.  Loud III/VI SEM at LSB, NO rubs, or gallops.  ABDOMEN: Soft, nontender, nondistended. Bowel sounds present. No organomegaly or mass.  EXTREMITIES: No cyanosis, clubbing or edema b/l.    NEUROLOGIC: Cranial nerves II through XII are intact. No focal Motor or sensory deficits b/l.   PSYCHIATRIC: The patient is alert and oriented x 3.  SKIN: No obvious rash, lesion, or ulcer.    LABORATORY PANEL:   CBC Recent Labs  Lab 08/23/17 0653  WBC 6.0  HGB 9.8*  HCT 29.7*  PLT 277   ------------------------------------------------------------------------------------------------------------------  Chemistries  Recent Labs  Lab 08/22/17 1307 08/23/17 0653  NA  --  142  K  --  3.3*  CL  --  108  CO2  --  28  GLUCOSE  --  137*  BUN  --  19  CREATININE  --  0.84  CALCIUM  --  8.6*  AST 29  --   ALT 11*  --   ALKPHOS 49  --   BILITOT 2.7*  --    ------------------------------------------------------------------------------------------------------------------  Cardiac Enzymes Recent Labs  Lab 08/22/17 1104  TROPONINI 0.03*   ------------------------------------------------------------------------------------------------------------------  RADIOLOGY:  Dg Chest 2 View  Result Date: 08/22/2017 CLINICAL DATA:  Shortness of breath and lower extremity swelling. EXAM: CHEST - 2 VIEW COMPARISON:  Chest x-ray from yesterday. FINDINGS: The cardiac silhouette is obscured. Pulmonary vascular congestion with moderate right greater than left pleural effusions, unchanged. Associated bibasilar atelectasis. No pneumothorax. No acute osseous abnormality. IMPRESSION: Unchanged moderate bilateral pleural effusions with lower lobe atelectasis. Electronically Signed   By: Obie Dredge M.D.   On: 08/22/2017 11:37     ASSESSMENT AND PLAN:  84 year old female with no significant past medical history who has not seen a doctor in 20+ years presented to the hospital due to worsening lower extremity  edema, exertional dyspnea.  1.  CHF-this is acute on chronic systolic CHF.  Patient's echocardiogram showing mild LV dysfunction with EF of 45-50%.  She was also noted to have critical aortic stenosis. - Patient is somewhat somewhat hematic with some exertional dyspnea and still some lower exam edema.  Continue diuresis with IV Lasix, follow I's and O's and daily weights. -Await further cardiology input.  2.  Critical aortic stenosis-this was seen on the echocardiogram today. - Cardiology is possibly contemplating TAVR and this is to be discussed with Pt. And family in next 1-2 days.   3. Elevated Trop - due to supply demand ischemia from CHF/Aortic Stenosis.  - tele, cycle markers.   4. Anemia - Normocytic.   - No acute bleeding.  We will check iron, ferritin, B12, folate and reticulocyte count.  5.  Hypokalemia-secondary to IV diuresis.  Will place on oral potassium supplements.     All the records are reviewed and case discussed with Care Management/Social Worker. Management plans discussed with the patient, family and they are in agreement.  CODE STATUS: DNR  DVT Prophylaxis: Lovenox  TOTAL TIME TAKING CARE OF THIS PATIENT: 30 minutes.   POSSIBLE D/C IN 1-2 DAYS, DEPENDING ON CLINICAL CONDITION.   Houston SirenSAINANI,Argenis Kumari J M.D on 08/23/2017 at 3:30 PM  Between 7am to 6pm - Pager - [phone]  After 6pm go to [email or web address] - Social research officer, governmentpassword EPAS [facility]  [facility] [facility]  Office  [phone]  CC: Primary care physician; Jaclyn Shaggyate, Denny C, MD

## 2017-08-23 NOTE — Progress Notes (Signed)
Cardiovascular and Pulmonary Nurse Navigator Note  Attempted to see patient to provide education on CHF.  Physical Therapy in evaluating patient.    [facility] 09/06/2017 at 9:00 a.m.  [facility] place [facility] nurses to provide CHF education.    Army Meliaiane Wright, RN, BSN, [facility] Cardiovascular and Pulmonary Nurse Navigator

## 2017-08-23 NOTE — Progress Notes (Signed)
*  PRELIMINARY RESULTS* Echocardiogram 2D Echocardiogram has been performed.  Cristela BlueHege, Leslee Suire 08/23/2017, 9:15 AM

## 2017-08-23 NOTE — Progress Notes (Signed)
PT Cancellation Note  Patient Details Name: Carolyn CatholicKairo Osmon MRN: [id number] DOB: [birth year]   Cancelled Treatment:    Reason Eval/Treat Not Completed: Other (comment). Consult received and chart reviewed. Pt currently eating late lunch and request therapist to re-attempt. Will try another time. Pt reports she has already walked in room with RN.   Dereonna Lensing 08/23/2017, 2:00 PM Elizabeth PalauStephanie Kaylia Winborne, PT, DPT [phone]

## 2017-08-23 NOTE — Evaluation (Signed)
Physical Therapy Evaluation Patient Details Name: Carolyn Clarke MRN: [id number] DOB: [birth year] Today's Date: 08/23/2017   History of Present Illness  Pt admitted for CHF. Pt with complaints of SOB symptoms. History includes anemia.   Clinical Impression  Pt is a pleasant 84 year old female who was admitted for CHF. Pt performs bed mobility with cga, transfers with min assist, and ambulation with cga and RW. Pt demonstrates deficits with endurance/mobility/balance/strength. Pt reports she has been having difficulty with balance, however demonstrates improved balance with RW use. Recommend using RW for all mobility at this time for fall prevention. Somewhat self limiting in gait distance at this time, however demonstrates ability to ambulate hallway distances. Not quite at baseline level, yet shows motivation to improve. Would benefit from skilled PT to address above deficits and promote optimal return to PLOF. Recommend transition to HHPT upon discharge from acute hospitalization.       Follow Up Recommendations Home health PT;Supervision for mobility/OOB    Equipment Recommendations  Rolling walker with 5" wheels    Recommendations for Other Services       Precautions / Restrictions Precautions Precautions: Fall Restrictions Weight Bearing Restrictions: No      Mobility  Bed Mobility Overal bed mobility: Needs Assistance Bed Mobility: Supine to Sit     Supine to sit: Min guard     General bed mobility comments: safe technique use of rail for UE. Once seated at EOB, able to sit with upright posture  Transfers Overall transfer level: Needs assistance Equipment used: Rolling walker (2 wheeled) Transfers: Sit to/from Stand Sit to Stand: Min assist         General transfer comment: tries to pull on RW needing increased assist for standing. 2nd attempt performed with pushing from seated surface with improved technique and pt able to stand with upright  posture  Ambulation/Gait Ambulation/Gait assistance: Min guard Ambulation Distance (Feet): 15 Feet Assistive device: Rolling walker (2 wheeled) Gait Pattern/deviations: Step-through pattern     General Gait Details: ambulated in room, refusing to ambulate increased distance at this time, "wants to take it slow". Safe technique with good obstacle avoidance and navigation of tight spaces.  Stairs            Wheelchair Mobility    Modified Rankin (Stroke Patients Only)       Balance Overall balance assessment: History of Falls;Needs assistance Sitting-balance support: Feet supported Sitting balance-Leahy Scale: Good     Standing balance support: Bilateral upper extremity supported Standing balance-Leahy Scale: Good Standing balance comment: with RW                             Pertinent Vitals/Pain Pain Assessment: No/denies pain    Home Living Family/patient expects to be discharged to:: Private residence Living Arrangements: Spouse/significant other Available Help at Discharge: Family Type of Home: House Home Access: Stairs to enter Entrance Stairs-Rails: Can reach both Entrance Stairs-Number of Steps: 3 Home Layout: One level Home Equipment: Cane - single point      Prior Function Level of Independence: Independent with assistive device(s)         Comments: was previously using SPC, reports several falls, however no fractures     Hand Dominance        Extremity/Trunk Assessment   Upper Extremity Assessment Upper Extremity Assessment: Generalized weakness(B UE grossly 4/5)    Lower Extremity Assessment Lower Extremity Assessment: Generalized weakness(B LE grossly 4-/5)  Communication   Communication: No difficulties  Cognition Arousal/Alertness: Awake/alert Behavior During Therapy: WFL for tasks assessed/performed Overall Cognitive Status: Within Functional Limits for tasks assessed                                         General Comments      Exercises Other Exercises Other Exercises: Seated ther-ex performed while in recliner including ankle pumps, hip abd/add, and SAQ. All ther-ex performed x 10 reps with cues for correct technique and supervision.   Assessment/Plan    PT Assessment Patient needs continued PT services  PT Problem List Decreased strength;Decreased balance;Decreased mobility;Decreased safety awareness       PT Treatment Interventions Gait training;Therapeutic exercise;DME instruction    PT Goals (Current goals can be found in the Care Plan section)  Acute Rehab PT Goals Patient Stated Goal: to go home PT Goal Formulation: With patient Time For Goal Achievement: 09/06/17 Potential to Achieve Goals: Good    Frequency Min 2X/week   Barriers to discharge        Co-evaluation               AM-PAC PT "6 Clicks" Daily Activity  Outcome Measure Difficulty turning over in bed (including adjusting bedclothes, sheets and blankets)?: Unable Difficulty moving from lying on back to sitting on the side of the bed? : Unable Difficulty sitting down on and standing up from a chair with arms (e.g., wheelchair, bedside commode, etc,.)?: Unable Help needed moving to and from a bed to chair (including a wheelchair)?: A Little Help needed walking in hospital room?: A Little Help needed climbing 3-5 steps with a railing? : A Little 6 Click Score: 12    End of Session Equipment Utilized During Treatment: Gait belt Activity Tolerance: Patient tolerated treatment well Patient left: in chair;with chair alarm set;with family/visitor present Nurse Communication: Mobility status PT Visit Diagnosis: Unsteadiness on feet (R26.81);Muscle weakness (generalized) (M62.81);Repeated falls (R29.6);Difficulty in walking, not elsewhere classified (R26.2)    Time: 1355-1430 PT Time Calculation (min) (ACUTE ONLY): 35 min   Charges:   PT Evaluation $PT Eval Low Complexity: 1 Low PT  Treatments $Therapeutic Exercise: 8-22 mins $Therapeutic Activity: 8-22 mins   PT G Codes:        Carolyn Clarke, PT, DPT [phone]   Carolyn Clarke 08/23/2017, 3:43 PM

## 2017-08-23 NOTE — Progress Notes (Signed)
Initial Nutrition Assessment  DOCUMENTATION CODES:   Non-severe (moderate) malnutrition in context of chronic illness  INTERVENTION:   Ensure Enlive po BID, each supplement provides 350 kcal and 20 grams of protein  Liberalize diet  NUTRITION DIAGNOSIS:   Moderate Malnutrition related to chronic illness(heart disease, advanced age ) as evidenced by moderate fat depletion, moderate muscle depletion, severe muscle depletion.  GOAL:   Patient will meet greater than or equal to 90% of their needs  MONITOR:   PO intake, Supplement acceptance, Labs, Weight trends, Skin, I & O's  REASON FOR ASSESSMENT:   Consult Diet education  ASSESSMENT:   84 y.o. female with no significant past medical history presents to hospital secondary to worsening lower extremity edema and shortness of breath. Pt found to have CHF   Met with pt in room today. Pt is a poor historian but reports good appetite and oral intake at baseline. Pt denies any trouble chewing or swallowing. Pt reports her weight is stable; there is no recent wt history to confirm. Pt ate 90% of her breakfast today which included 2  pancakes. Pt reports that she likes strawberry flavors; RD will order Ensure. Pt not appropriate for CHF education but RD did briefly touch on not adding excess salt to foods. Educational handouts left at bedside for family members to utilize.   Medications reviewed and include: lovenox, lasix  Labs reviewed: K 3.3(L), Ca 8.6(L) BNP- 3354(H)- 4/18 Hgb 9.8(L), Hct 29.7(L)  NUTRITION - FOCUSED PHYSICAL EXAM:    Most Recent Value  Orbital Region  No depletion  Upper Arm Region  Moderate depletion  Thoracic and Lumbar Region  Mild depletion  Buccal Region  Mild depletion  Temple Region  Moderate depletion  Clavicle Bone Region  Severe depletion  Clavicle and Acromion Bone Region  Severe depletion  Scapular Bone Region  Severe depletion  Dorsal Hand  Severe depletion  Patellar Region  No depletion   Anterior Thigh Region  No depletion  Posterior Calf Region  No depletion  Edema (RD Assessment)  Moderate  Hair  Reviewed  Eyes  Reviewed  Mouth  Reviewed  Skin  Reviewed  Nails  Reviewed     Diet Order:  Diet 2 gram sodium Room service appropriate? Yes; Fluid consistency: Thin  EDUCATION NEEDS:   Education needs have been addressed  Skin:  Skin Assessment: Reviewed RN Assessment  Last BM:  4/17  Height:   Ht Readings from Last 1 Encounters:  08/22/17 _0  (1.676 m)    Weight:   Wt Readings from Last 1 Encounters:  08/23/17 150 lb (68 kg)    Ideal Body Weight:  59 kg  BMI:  Body mass index is 24.21 kg/m.  Estimated Nutritional Needs:   Kcal:  1400-1600kcal/day   Protein:  76-89g/day   Fluid:  per MD  Koleen Distance MS, RD, LDN Pager #[phone] After Hours Pager: [phone]

## 2017-08-23 NOTE — Progress Notes (Signed)
Progress Note  Patient Name: Carolyn Clarke Date of Encounter: 08/23/2017  Primary Cardiologist: Julien Nordmann, MD   Subjective   She reports improvement in shortness of breath but not back to baseline.  Inpatient Medications    Scheduled Meds: . enoxaparin (LOVENOX) injection  40 mg Subcutaneous Q24H  . feeding supplement (ENSURE ENLIVE)  237 mL Oral BID BM  . furosemide  40 mg Intravenous BID  . potassium chloride  20 mEq Oral BID  . sodium chloride flush  3 mL Intravenous Q12H   Continuous Infusions:  PRN Meds: acetaminophen **OR** acetaminophen, ondansetron **OR** ondansetron (ZOFRAN) IV, sodium chloride flush   Vital Signs    Vitals:   08/23/17 0008 08/23/17 0546 08/23/17 0755 08/23/17 1632  BP: 138/63 (!) 117/57 133/62 (!) 109/59  Pulse: 71 81 74 82  Resp: 16 17 18 18   Temp: 97.6 F (36.4 C) 97.8 F (36.6 C) 98.1 F (36.7 C) 98.5 F (36.9 C)  TempSrc: Oral Oral Oral Oral  SpO2: 100% 100% 100% 100%  Weight:  150 lb (68 kg)    Height:        Intake/Output Summary (Last 24 hours) at 08/23/2017 1751 Last data filed at 08/23/2017 0604 Gross per 24 hour  Intake 240 ml  Output 2350 ml  Net -2110 ml   Filed Weights   08/22/17 1035 08/22/17 1525 08/23/17 0546  Weight: 162 lb (73.5 kg) 153 lb (69.4 kg) 150 lb (68 kg)    Telemetry    Normal sinus rhythm- Personally Reviewed  ECG    Not done today- Personally Reviewed  Physical Exam   GEN: No acute distress.   Neck:  Mild JVD Cardiac: RRR, no  rubs, or gallops.  3 out of 6 crescendo decrescendo systolic murmur which is late peaking with very diminished S2. Respiratory:  Bibasilar crackles GI: Soft, nontender, non-distended  MS: No edema; No deformity. Neuro:  Nonfocal  Psych: Normal affect   Labs    Chemistry Recent Labs  Lab 08/22/17 1104 08/22/17 1307 08/23/17 0653  NA 144  --  142  K 3.7  --  3.3*  CL 113*  --  108  CO2 23  --  28  GLUCOSE 115*  --  137*  BUN 19  --  19    CREATININE 0.79  --  0.84  CALCIUM 8.9  --  8.6*  PROT  --  6.0*  --   ALBUMIN  --  3.4*  --   AST  --  29  --   ALT  --  11*  --   ALKPHOS  --  49  --   BILITOT  --  2.7*  --   GFRNONAA >60  --  60*  GFRAA >60  --  >60  ANIONGAP 8  --  6     Hematology Recent Labs  Lab 08/22/17 1104 08/23/17 0653  WBC 3.7 6.0  RBC 2.89* 3.17*  3.13*  HGB 8.9* 9.8*  HCT 27.4* 29.7*  MCV 94.7 93.5  MCH 30.6 30.9  MCHC 32.3 33.0  RDW 21.0* 18.0*  PLT 244 277    Cardiac Enzymes Recent Labs  Lab 08/22/17 1104  TROPONINI 0.03*   No results for input(s): TROPIPOC in the last 168 hours.   BNP Recent Labs  Lab 08/22/17 1307  BNP 3,354.0*     DDimer No results for input(s): DDIMER in the last 168 hours.   Radiology    Dg Chest 2 View  Result Date:  08/22/2017 CLINICAL DATA:  Shortness of breath and lower extremity swelling. EXAM: CHEST - 2 VIEW COMPARISON:  Chest x-ray from yesterday. FINDINGS: The cardiac silhouette is obscured. Pulmonary vascular congestion with moderate right greater than left pleural effusions, unchanged. Associated bibasilar atelectasis. No pneumothorax. No acute osseous abnormality. IMPRESSION: Unchanged moderate bilateral pleural effusions with lower lobe atelectasis. Electronically Signed   By: Obie DredgeWilliam T Derry M.D.   On: 08/22/2017 11:37    Cardiac Studies   Echocardiogram was done today and personally reviewed by me:  Study Conclusions  - Left ventricle: The cavity size was normal. There was mild   concentric hypertrophy. Systolic function was mildly reduced. The   estimated ejection fraction was in the range of 45% to 50%.   Probable hypokinesis of the lateral and inferolateral myocardium.   Features are consistent with a pseudonormal left ventricular   filling pattern, with concomitant abnormal relaxation and   increased filling pressure (grade 2 diastolic dysfunction). - Aortic valve: There was critical stenosis. Mean gradient (S): 55   mm Hg.  Valve area (VTI): 0.31 cm^2. - Mitral valve: There was moderate regurgitation. - Left atrium: The atrium was moderately dilated. - Pericardium, extracardiac: A trivial pericardial effusion was   identified. There was a right pleural effusion. There was a left   pleural effusion.  Patient Profile     84 y.o. female with past medical history of anemia who has not seen her doctor in about 30 years.  She presented with shortness of breath and was found to have congestive heart failure and significant volume overload.  Assessment & Plan    1.  Acute combined systolic/diastolic heart failure in the setting of critical aortic stenosis: The patient continues to be volume overloaded.  I recommend continuing IV furosemide.  Given that blood pressure is somewhat on the low side, I am not planning to start an ACE inhibitor/ARB or beta blockers at the present time given critical aortic stenosis.  2.  Critical aortic stenosis: The patient was not sure about how to proceed with her options.  I had a meeting in the afternoon with the patient, husband, nephew and grandson.  I discussed the finding of critical aortic stenosis and natural history especially in the setting of heart failure.  I explained to them the overall poor prognosis with this without intervention.  I think she would be a reasonable candidate for TAVR and I extensively discussed the procedure in details as well as the testing required before. After prolonged discussion, they decided for no aggressive measures and they want medical therapy only. I explained to them that life expectancy with this is probably not more than 6 months and she would be a good candidate for hospice care if they desire.  Total encounter time greater than 35 minutes.  Greater than 50% was spent in counseling and coordination of care with the patient.  For questions or updates, please contact [facility] Please consult [email or web address] for contact info under  Cardiology/STEMI.      Signed, Lorine BearsMuhammad Arida, MD  08/23/2017, 5:51 PM

## 2017-08-23 NOTE — Plan of Care (Signed)
  Problem: Clinical Measurements: Goal: Ability to maintain clinical measurements within normal limits will improve Outcome: Progressing Goal: Diagnostic test results will improve Outcome: Progressing Goal: Respiratory complications will improve Outcome: Progressing Goal: Cardiovascular complication will be avoided Outcome: Progressing   

## 2017-08-24 ENCOUNTER — Other Ambulatory Visit: Payer: Self-pay

## 2017-08-24 LAB — BASIC METABOLIC PANEL
ANION GAP: 5 (ref 5–15)
BUN: 17 mg/dL (ref 6–20)
CHLORIDE: 105 mmol/L (ref 101–111)
CO2: 33 mmol/L — ABNORMAL HIGH (ref 22–32)
Calcium: 8.8 mg/dL — ABNORMAL LOW (ref 8.9–10.3)
Creatinine, Ser: 0.87 mg/dL (ref 0.44–1.00)
GFR calc Af Amer: >60 mL/min (ref >60)
GFR, EST NON AFRICAN AMERICAN: 57 mL/min — AB (ref >60)
GLUCOSE: 125 mg/dL — AB (ref 65–99)
POTASSIUM: 4.2 mmol/L (ref 3.5–5.1)
SODIUM: 143 mmol/L (ref 135–145)

## 2017-08-24 LAB — MAGNESIUM: MAGNESIUM: 1.5 mg/dL — AB (ref 1.7–2.4)

## 2017-08-24 MED ORDER — MAGNESIUM SULFATE 4 GM/100ML IV SOLN
4.0000 g | Freq: Once | INTRAVENOUS | Status: AC
Start: 2017-08-24 — End: 2017-08-24
  Administered 2017-08-24: 4 g via INTRAVENOUS
  Filled 2017-08-24: qty 100

## 2017-08-24 NOTE — Progress Notes (Signed)
[facility] - [place] at [facility]   PATIENT NAME: Carolyn Clarke    MR#:  [id number]  DATE OF BIRTH:  1926/08/05  SUBJECTIVE:   Shortness of breath has improved since admission.  Patient denies any chest pains.  No other complaints or events overnight.  Cardiology extensively discussed with the patient and family yesterday and she does not want any aggressive intervention for her critical aortic stenosis.  They are considering medical management and possible hospice services.  REVIEW OF SYSTEMS:    Review of Systems  Constitutional: Negative for chills and fever.  HENT: Negative for congestion and tinnitus.   Eyes: Negative for blurred vision and double vision.  Respiratory: Positive for shortness of breath (improved). Negative for cough and wheezing.   Cardiovascular: Negative for chest pain, orthopnea, leg swelling and PND.  Gastrointestinal: Negative for abdominal pain, diarrhea, nausea and vomiting.  Genitourinary: Negative for dysuria and hematuria.  Neurological: Negative for dizziness, sensory change and focal weakness.  All other systems reviewed and are negative.   Nutrition: Heart Healthy Tolerating Diet: Yes Tolerating PT: Eval noted.   DRUG ALLERGIES:  No Known Allergies  VITALS:  Blood pressure (!) 115/57, pulse 86, temperature (!) 97.4 F (36.3 C), temperature source Oral, resp. rate 18, height 5\' 6"  (1.676 m), weight 63.5 kg (140 lb), SpO2 100 %.  PHYSICAL EXAMINATION:   Physical Exam  GENERAL:  84 y.o.-year-old patient lying in bed in no acute distress.  EYES: Pupils equal, round, reactive to light and accommodation. No scleral icterus. Extraocular muscles intact.  HEENT: Head atraumatic, normocephalic. Oropharynx and nasopharynx clear.  NECK:  Supple, no jugular venous distention. No thyroid enlargement, no tenderness.  LUNGS: Normal breath sounds bilaterally, no wheezing, bibasilar rales, rhonchi. No use of accessory muscles of  respiration.  CARDIOVASCULAR: S1, S2 normal. Loud III/VI SEM at LSB, NO rubs, or gallops.  ABDOMEN: Soft, nontender, nondistended. Bowel sounds present. No organomegaly or mass.  EXTREMITIES: No cyanosis, clubbing or edema b/l.    NEUROLOGIC: Cranial nerves II through XII are intact. No focal Motor or sensory deficits b/l.   PSYCHIATRIC: The patient is alert and oriented x 3.  SKIN: No obvious rash, lesion, or ulcer.    LABORATORY PANEL:   CBC Recent Labs  Lab 08/23/17 0653  WBC 6.0  HGB 9.8*  HCT 29.7*  PLT 277   ------------------------------------------------------------------------------------------------------------------  Chemistries  Recent Labs  Lab 08/22/17 1307  08/24/17 0528  NA  --    < > 143  K  --    < > 4.2  CL  --    < > 105  CO2  --    < > 33*  GLUCOSE  --    < > 125*  BUN  --    < > 17  CREATININE  --    < > 0.87  CALCIUM  --    < > 8.8*  MG  --   --  1.5*  AST 29  --   --   ALT 11*  --   --   ALKPHOS 49  --   --   BILITOT 2.7*  --   --    < > = values in this interval not displayed.   ------------------------------------------------------------------------------------------------------------------  Cardiac Enzymes Recent Labs  Lab 08/22/17 1104  TROPONINI 0.03*   ------------------------------------------------------------------------------------------------------------------  RADIOLOGY:  No results found.   ASSESSMENT AND PLAN:   84 year old female with no significant past medical history who has not seen a  doctor in 20+ years presented to the hospital due to worsening lower extremity edema, exertional dyspnea.  1.  CHF-this is acute on chronic systolic CHF.  Patient's echocardiogram showing mild LV dysfunction with EF of 45-50%.  She was also noted to have critical aortic stenosis. - improved since admission and no further LE edema.  Continue diuresis with IV Lasix, and about 6.4 L (-) since admission.  - appreciate Cards input. Not  on BB, ACE due to some relative Hypotension.   2.  Critical aortic stenosis-this was seen on the echocardiogram yesterday.  -Cardiology discussed care with the patient and family yesterday and they do not want to pursue any aggressive intervention presently.  They want to pursue medical management. - Family also considering hospice services.  3. Elevated Trop - due to supply demand ischemia from CHF/Aortic Stenosis.   4. Anemia - Normocytic.   - No acute bleeding.  Patient's iron studies are consistent with anemia of chronic disease.  5.  Hypokalemia-secondary to IV diuresis.  Improved with supplementation.   PT recommending Home Health services.   All the records are reviewed and case discussed with Care Management/Social Worker. Management plans discussed with the patient, family and they are in agreement.  CODE STATUS: DNR  DVT Prophylaxis: Lovenox  TOTAL TIME TAKING CARE OF THIS PATIENT: 30 minutes.   POSSIBLE D/C IN 1-2 DAYS, DEPENDING ON CLINICAL CONDITION.   [place] SirenSAINANI,Constantinos Krempasky J M.D on 08/24/2017 at 2:10 PM  Between 7am to 6pm - Pager - [phone]  After 6pm go to [email or web address] - Social research officer, governmentpassword EPAS [facility]  [facility] [place] [facility]  Office  [phone]  CC: Primary care physician; Jaclyn Shaggyate, Denny C, MD

## 2017-08-24 NOTE — Progress Notes (Addendum)
Progress Note  Patient Name: Carolyn Clarke Date of Encounter: 08/24/2017  Primary Cardiologist: Julien Nordmann, MD   Subjective  Feels better today  Inpatient Medications    Scheduled Meds: . enoxaparin (LOVENOX) injection  40 mg Subcutaneous Q24H  . feeding supplement (ENSURE ENLIVE)  237 mL Oral BID BM  . furosemide  40 mg Intravenous BID  . potassium chloride  20 mEq Oral BID  . sodium chloride flush  3 mL Intravenous Q12H   Continuous Infusions:  PRN Meds: acetaminophen **OR** acetaminophen, ondansetron **OR** ondansetron (ZOFRAN) IV, sodium chloride flush   Vital Signs    Vitals:   08/23/17 1632 08/23/17 1943 08/24/17 0339 08/24/17 0930  BP: (!) 109/59 (!) 110/48 (!) 124/57 (!) 115/57  Pulse: 82 79 79 86  Resp: 18 18 16 18   Temp: 98.5 F (36.9 C) 97.8 F (36.6 C) 98.1 F (36.7 C) (!) 97.4 F (36.3 C)  TempSrc: Oral Oral Oral Oral  SpO2: 100% 100% 98% 100%  Weight:   140 lb (63.5 kg)   Height:        Intake/Output Summary (Last 24 hours) at 08/24/2017 1326 Last data filed at 08/24/2017 1101 Gross per 24 hour  Intake 343 ml  Output 4350 ml  Net -4007 ml   Filed Weights   08/22/17 1525 08/23/17 0546 08/24/17 0339  Weight: 153 lb (69.4 kg) 150 lb (68 kg) 140 lb (63.5 kg)    Telemetry    Sinus rhythm personally Reviewed  ECG    Not done today- Personally Reviewed  Physical Exam   Well developed and nourished in no acute distress HENT normal Neck supple with JVP-flat Clear Carotids parvus and tardus Regular rate and rhythm, 3/6 systolic with a single S2  abd-soft with active BS No Clubbing cyanosis edema Skin-warm and dry A & Oriented  Grossly normal sensory and motor function   Labs    Chemistry Recent Labs  Lab 08/22/17 1104 08/22/17 1307 08/23/17 0653 08/24/17 0528  NA 144  --  142 143  K 3.7  --  3.3* 4.2  CL 113*  --  108 105  CO2 23  --  28 33*  GLUCOSE 115*  --  137* 125*  BUN 19  --  19 17  CREATININE 0.79  --  0.84  0.87  CALCIUM 8.9  --  8.6* 8.8*  PROT  --  6.0*  --   --   ALBUMIN  --  3.4*  --   --   AST  --  29  --   --   ALT  --  11*  --   --   ALKPHOS  --  49  --   --   BILITOT  --  2.7*  --   --   GFRNONAA >60  --  60* 57*  GFRAA >60  --  >60 >60  ANIONGAP 8  --  6 5     Hematology Recent Labs  Lab 08/22/17 1104 08/23/17 0653  WBC 3.7 6.0  RBC 2.89* 3.17*  3.13*  HGB 8.9* 9.8*  HCT 27.4* 29.7*  MCV 94.7 93.5  MCH 30.6 30.9  MCHC 32.3 33.0  RDW 21.0* 18.0*  PLT 244 277    Cardiac Enzymes Recent Labs  Lab 08/22/17 1104  TROPONINI 0.03*   No results for input(s): TROPIPOC in the last 168 hours.   BNP Recent Labs  Lab 08/22/17 1307  BNP 3,354.0*     DDimer No results for input(s): DDIMER  in the last 168 hours.   Radiology    No results found.  Cardiac Studies   Echocardiogram was done today and personally reviewed by me:  Study Conclusions  - Left ventricle: The cavity size was normal. There was mild   concentric hypertrophy. Systolic function was mildly reduced. The   estimated ejection fraction was in the range of 45% to 50%.   Probable hypokinesis of the lateral and inferolateral myocardium.   Features are consistent with a pseudonormal left ventricular   filling pattern, with concomitant abnormal relaxation and   increased filling pressure (grade 2 diastolic dysfunction). - Aortic valve: There was critical stenosis. Mean gradient (S): 55   mm Hg. Valve area (VTI): 0.31 cm^2. - Mitral valve: There was moderate regurgitation. - Left atrium: The atrium was moderately dilated. - Pericardium, extracardiac: A trivial pericardial effusion was   identified. There was a right pleural effusion. There was a left   pleural effusion.  Patient Profile     84 y.o. female with past medical history of anemia who has not seen her doctor in about 30 years.  She presented with shortness of breath and was found to have congestive heart failure and significant volume  overload.  Assessment & Plan    1.  Acute combined systolic/diastolic heart failure in the setting of critical aortic stenosis:    2.  Critical aortic stenosis:   The patient's heart failure is better compensated.  I spent time with her and her goddaughter trying to review the physiology.  It is noteworthy that she is very hard of hearing and I use my stethoscope in her ears.  She seemed to have a better understanding.  She is aware that her aortic stenosis is potentially life ending.  I suggested that they consider hospice consultation so that when it and comes, she can be made as comfortable as she can.  Low-dose oral diuretics are probably reasonable.  Preload reduction can be an problem with critical aortic stenosis.  As we discussed on the phone, beta-blockers and other hypertensive agents are also a risk  We will have her on Lasix 40 daily  We will sign off.     I explained to them that life expectancy with this is probably not more than 6 months and she would be a good candidate for hospice care if they desire.  >35 min spent > 50% time in counseling  For questions or updates, please contact [facility] Please consult [email or web address] for contact info under Cardiology/STEMI.      Signed, Sherryl MangesSteven Joniyah Mallinger, MD  08/24/2017, 1:26 PM

## 2017-08-25 MED ORDER — FUROSEMIDE 40 MG PO TABS
40.0000 mg | ORAL_TABLET | Freq: Every day | ORAL | Status: DC
  Administered 2017-08-26: 40 mg via ORAL
  Filled 2017-08-25: qty 1

## 2017-08-25 MED ORDER — FUROSEMIDE 10 MG/ML IJ SOLN: 40.0000 mg | Freq: Every day | INTRAMUSCULAR | Status: DC

## 2017-08-25 NOTE — Progress Notes (Signed)
Pt. Has catnapped throughout the night, she sat up in chair for about an hour. No c/o SOB or acute distress noted. C/o pain x1, medicated with effective results.

## 2017-08-25 NOTE — Progress Notes (Signed)
Progress Note  Patient Name: Carolyn Clarke Date of Encounter: 08/25/2017  Primary Cardiologist: Julien Nordmann, MD   Subjective  Pt asleep   Inpatient Medications    Scheduled Meds: . enoxaparin (LOVENOX) injection  40 mg Subcutaneous Q24H  . feeding supplement (ENSURE ENLIVE)  237 mL Oral BID BM  . [START ON 08/26/2017] furosemide  40 mg Intravenous Daily  . potassium chloride  20 mEq Oral BID  . sodium chloride flush  3 mL Intravenous Q12H   Continuous Infusions:  PRN Meds: acetaminophen **OR** acetaminophen, ondansetron **OR** ondansetron (ZOFRAN) IV, sodium chloride flush   Vital Signs    Vitals:   08/24/17 1749 08/24/17 2004 08/25/17 0346 08/25/17 0757  BP: 122/67 99/60 105/68 108/64  Pulse: 88 90 79 81  Resp: 18 16 16 19   Temp: 98.1 F (36.7 C) 98 F (36.7 C) 97.9 F (36.6 C) 98 F (36.7 C)  TempSrc: Oral Oral Oral Oral  SpO2: 94% 100% 96% 97%  Weight:   143 lb 4.8 oz (65 kg)   Height:        Intake/Output Summary (Last 24 hours) at 08/25/2017 1154 Last data filed at 08/25/2017 1116 Gross per 24 hour  Intake 243 ml  Output 2175 ml  Net -1932 ml   Filed Weights   08/23/17 0546 08/24/17 0339 08/25/17 0346  Weight: 150 lb (68 kg) 140 lb (63.5 kg) 143 lb 4.8 oz (65 kg)    Telemetry  sinus personally Reviewed  ECG    Not done today- Personally Reviewed  Physical Exam   Asleep Not examined    Labs    Chemistry Recent Labs  Lab 08/22/17 1104 08/22/17 1307 08/23/17 0653 08/24/17 0528  NA 144  --  142 143  K 3.7  --  3.3* 4.2  CL 113*  --  108 105  CO2 23  --  28 33*  GLUCOSE 115*  --  137* 125*  BUN 19  --  19 17  CREATININE 0.79  --  0.84 0.87  CALCIUM 8.9  --  8.6* 8.8*  PROT  --  6.0*  --   --   ALBUMIN  --  3.4*  --   --   AST  --  29  --   --   ALT  --  11*  --   --   ALKPHOS  --  49  --   --   BILITOT  --  2.7*  --   --   GFRNONAA >60  --  60* 57*  GFRAA >60  --  >60 >60  ANIONGAP 8  --  6 5     Hematology Recent  Labs  Lab 08/22/17 1104 08/23/17 0653  WBC 3.7 6.0  RBC 2.89* 3.17*  3.13*  HGB 8.9* 9.8*  HCT 27.4* 29.7*  MCV 94.7 93.5  MCH 30.6 30.9  MCHC 32.3 33.0  RDW 21.0* 18.0*  PLT 244 277    Cardiac Enzymes Recent Labs  Lab 08/22/17 1104  TROPONINI 0.03*   No results for input(s): TROPIPOC in the last 168 hours.   BNP Recent Labs  Lab 08/22/17 1307  BNP 3,354.0*     DDimer No results for input(s): DDIMER in the last 168 hours.   Radiology    No results found.  Cardiac Studies   Echocardiogram was done today and personally reviewed by me:  Study Conclusions  - Left ventricle: The cavity size was normal. There was mild   concentric hypertrophy. Systolic  function was mildly reduced. The   estimated ejection fraction was in the range of 45% to 50%.   Probable hypokinesis of the lateral and inferolateral myocardium.   Features are consistent with a pseudonormal left ventricular   filling pattern, with concomitant abnormal relaxation and   increased filling pressure (grade 2 diastolic dysfunction). - Aortic valve: There was critical stenosis. Mean gradient (S): 55   mm Hg. Valve area (VTI): 0.31 cm^2. - Mitral valve: There was moderate regurgitation. - Left atrium: The atrium was moderately dilated. - Pericardium, extracardiac: A trivial pericardial effusion was   identified. There was a right pleural effusion. There was a left   pleural effusion.  Patient Profile     84 y.o. female with past medical history of anemia who has not seen her doctor in about 30 years.  She presented with shortness of breath and was found to have congestive heart failure and significant volume overload.  Assessment & Plan    1.  Acute combined systolic/diastolic heart failure in the setting of critical aortic stenosis:    2.  Critical aortic stenosis:   3. Anemia inconsistent with Fe Def  Will change diuretic to po  From yday >> I spent time with her and her goddaughter trying  to review the physiology.  It is noteworthy that she is very hard of hearing and I use my stethoscope in her ears.  She seemed to have a better understanding.  She is aware that her aortic stenosis is potentially life ending.  I suggested that they consider hospice consultation so that when it and comes, she can be made as comfortable as she can.<<   From Dr Kirke CorinArida and with which I agree I explained to them that life expectancy with this is probably not more than 6 months and she would be a good candidate for hospice care if they desire.      Signed, Sherryl MangesSteven Derrik Mceachern, MD  08/25/2017, 11:54 AM

## 2017-08-25 NOTE — Progress Notes (Signed)
[facility] - [place] at [facility]   PATIENT NAME: Carolyn Clarke    MR#:  [id number]  DATE OF BIRTH:  Jan 31, 1927  SUBJECTIVE:   Patient denies any shortness of breath, chest pain, dizziness.  No other acute complaints or events overnight.  REVIEW OF SYSTEMS:    Review of Systems  Constitutional: Negative for chills and fever.  HENT: Negative for congestion and tinnitus.   Eyes: Negative for blurred vision and double vision.  Respiratory: Positive for shortness of breath (improved). Negative for cough and wheezing.   Cardiovascular: Negative for chest pain, orthopnea, leg swelling and PND.  Gastrointestinal: Negative for abdominal pain, diarrhea, nausea and vomiting.  Genitourinary: Negative for dysuria and hematuria.  Neurological: Negative for dizziness, sensory change and focal weakness.  All other systems reviewed and are negative.   Nutrition: Heart Healthy Tolerating Diet: Yes Tolerating PT: Eval noted.   DRUG ALLERGIES:  No Known Allergies  VITALS:  Blood pressure 108/64, pulse 81, temperature 98 F (36.7 C), temperature source Oral, resp. rate 19, height 5\' 6"  (1.676 m), weight 65 kg (143 lb 4.8 oz), SpO2 97 %.  PHYSICAL EXAMINATION:   Physical Exam  GENERAL:  84 y.o.-year-old patient lying in bed in no acute distress.  EYES: Pupils equal, round, reactive to light and accommodation. No scleral icterus. Extraocular muscles intact.  HEENT: Head atraumatic, normocephalic. Oropharynx and nasopharynx clear.  NECK:  Supple, no jugular venous distention. No thyroid enlargement, no tenderness.  LUNGS: Normal breath sounds bilaterally, no wheezing, bibasilar rales, rhonchi. No use of accessory muscles of respiration.  CARDIOVASCULAR: S1, S2 normal. Loud III/VI SEM at LSB, NO rubs, or gallops.  ABDOMEN: Soft, nontender, nondistended. Bowel sounds present. No organomegaly or mass.  EXTREMITIES: No cyanosis, clubbing or edema b/l.    NEUROLOGIC: Cranial  nerves II through XII are intact. No focal Motor or sensory deficits b/l.   PSYCHIATRIC: The patient is alert and oriented x 3.  SKIN: No obvious rash, lesion, or ulcer.    LABORATORY PANEL:   CBC Recent Labs  Lab 08/23/17 0653  WBC 6.0  HGB 9.8*  HCT 29.7*  PLT 277   ------------------------------------------------------------------------------------------------------------------  Chemistries  Recent Labs  Lab 08/22/17 1307  08/24/17 0528  NA  --    < > 143  K  --    < > 4.2  CL  --    < > 105  CO2  --    < > 33*  GLUCOSE  --    < > 125*  BUN  --    < > 17  CREATININE  --    < > 0.87  CALCIUM  --    < > 8.8*  MG  --   --  1.5*  AST 29  --   --   ALT 11*  --   --   ALKPHOS 49  --   --   BILITOT 2.7*  --   --    < > = values in this interval not displayed.   ------------------------------------------------------------------------------------------------------------------  Cardiac Enzymes Recent Labs  Lab 08/22/17 1104  TROPONINI 0.03*   ------------------------------------------------------------------------------------------------------------------  RADIOLOGY:  No results found.   ASSESSMENT AND PLAN:   84 year old female with no significant past medical history who has not seen a doctor in 20+ years presented to the hospital due to worsening lower extremity edema, exertional dyspnea.  1.  CHF-this is acute on chronic systolic CHF.  Patient's echocardiogram showing mild LV dysfunction with EF of  45-50%.  She was also noted to have critical aortic stenosis. - much improved since admission and no further LE edema.  Continue diuresis with IV Lasix, and about 8 L (-) since admission.  - appreciate Cards input. Not on BB, ACE due to some relative Hypotension and due to AS pt. Is Pre-load dependent.    2.  Critical aortic stenosis-this was seen on the echocardiogram.   -Cardiology discussed care with the patient and family yesterday and they do not want to  pursue any aggressive intervention presently.  They want to pursue medical management. - Discharge home with Hospice services.   3. Elevated Trop - due to supply demand ischemia from CHF/Aortic Stenosis.   4. Anemia - Normocytic.   - No acute bleeding.  Patient's iron studies are consistent with anemia of chronic disease.  5.  Hypokalemia- resolved w/ supplementation.   We will discharge patient home with hospice services tomorrow.  All the records are reviewed and case discussed with Care Management/Social Worker. Management plans discussed with the patient, family and they are in agreement.  CODE STATUS: DNR  DVT Prophylaxis: Lovenox  TOTAL TIME TAKING CARE OF THIS PATIENT: 30 minutes.   POSSIBLE D/C IN 1-2 DAYS, DEPENDING ON CLINICAL CONDITION.   [place] SirenSAINANI,Livvy Spilman J M.D on 08/25/2017 at 12:15 PM  Between 7am to 6pm - Pager - [phone]  After 6pm go to [email or web address] - Social research officer, governmentpassword EPAS [facility]  [facility] [place] [facility]  Office  [phone]  CC: Primary care physician; Jaclyn Shaggyate, Denny C, MD

## 2017-08-26 ENCOUNTER — Telehealth: Payer: Self-pay | Admitting: *Deleted

## 2017-08-26 DIAGNOSIS — I5031 Acute diastolic (congestive) heart failure: Secondary | ICD-10-CM

## 2017-08-26 MED ORDER — POTASSIUM CHLORIDE CRYS ER 20 MEQ PO TBCR: 20.0000 meq | EXTENDED_RELEASE_TABLET | Freq: Every day | ORAL | Status: DC

## 2017-08-26 MED ORDER — FUROSEMIDE 40 MG PO TABS: 40.0000 mg | ORAL_TABLET | Freq: Every day | ORAL | 1 refills | Status: DC

## 2017-08-26 MED ORDER — POTASSIUM CHLORIDE CRYS ER 20 MEQ PO TBCR: 20.0000 meq | EXTENDED_RELEASE_TABLET | Freq: Every day | ORAL | 1 refills | Status: DC

## 2017-08-26 NOTE — Progress Notes (Signed)
Patient being discharged to home with Donnella BiGoddaughter, Gail.  [facility] will [facility].

## 2017-08-26 NOTE — Progress Notes (Signed)
New referral for out patient Palliative to follow at home [facility]. Plan is for discharge home today with [facility] home health. Patient information faxed to referral. Dayna BarkerKaren Robertson RN, BSN, [facility] [place], hospital liaison [phone]

## 2017-08-26 NOTE — Care Management Important Message (Signed)
Copy of signed IM left in patient's room.    

## 2017-08-26 NOTE — Discharge Summary (Signed)
[facility] [place] [facility]   PATIENT NAME: Carolyn Clarke    MR#:  [id number]  DATE OF BIRTH:  Nov 09, 1926  DATE OF ADMISSION:  08/22/2017 ADMITTING PHYSICIAN: Enid Baasadhika Kalisetti, MD  DATE OF DISCHARGE: 08/26/2017  PRIMARY CARE PHYSICIAN: Jaclyn Shaggyate, Denny C, MD    ADMISSION DIAGNOSIS:  Systolic congestive heart failure, unspecified HF chronicity (HCC) [I50.20]  DISCHARGE DIAGNOSIS:  Principal Problem:   CHF (congestive heart failure) (HCC) Active Problems:   Anemia   SVT (supraventricular tachycardia) (HCC)   Elevated bilirubin   Aortic valve stenosis   Malnutrition of moderate degree   SECONDARY DIAGNOSIS:  History reviewed. No pertinent past medical history.  HOSPITAL COURSE:   84 year old female with no significant past medical history who has not seen a doctor in 20+ years presented to the hospital due to worsening lower extremity edema, exertional dyspnea.  1.  CHF-this was acute on chronic systolic CHF.  Patient's echocardiogram showing mild LV dysfunction with EF of 45-50%.  She was also noted to have critical aortic stenosis. -Patient was diuresed with IV Lasix and her shortness of breath and lower extremity edema has significantly improved since admission.  She is about 8 L negative. -She is being discharged on oral Lasix.  Due to her critical aortic stenosis and her relative hypotension she is not being discharged on any beta-blockers or ACE inhibitors presently.  Patient will follow-up with cardiology as an outpatient.  2.  Critical aortic stenosis-this was seen on the echocardiogram.   -Cardiology discussed care with the patient and family extensively and they do not want to pursue any aggressive intervention presently.  They want to pursue medical management. - pt. Is being discharged home with home health services with Palliative Services to follow at home.   3. Elevated Trop - due to supply demand ischemia from CHF/Aortic Stenosis.   4.  Anemia - Normocytic.   - No acute bleeding.  Patient's iron studies are consistent with anemia of chronic disease.  5.  Hypokalemia- resolved w/ supplementation.   Pt is being discharged with Home Health Services.    DISCHARGE CONDITIONS:   Stable  CONSULTS OBTAINED:  Treatment Team:  Antonieta IbaGollan, Timothy J, MD  DRUG ALLERGIES:  No Known Allergies  DISCHARGE MEDICATIONS:   Allergies as of 08/26/2017   No Known Allergies     Medication List    TAKE these medications   furosemide 40 MG tablet Commonly known as:  LASIX Take 1 tablet (40 mg total) by mouth daily. Start taking on:  08/27/2017   potassium chloride SA 20 MEQ tablet Commonly known as:  K-DUR,KLOR-CON Take 1 tablet (20 mEq total) by mouth daily. Start taking on:  08/27/2017         DISCHARGE INSTRUCTIONS:   DIET:  Cardiac diet  DISCHARGE CONDITION:  Stable  ACTIVITY:  Activity as tolerated  OXYGEN:  Home Oxygen: No.   Oxygen Delivery: room air  DISCHARGE LOCATION:  Home with Home Health PT, RN, Aide, Social Work and Palliative Care to follow.    If you experience worsening of your admission symptoms, develop shortness of breath, life threatening emergency, suicidal or homicidal thoughts you must seek medical attention immediately by calling 911 or calling your MD immediately  if symptoms less severe.  You Must read complete instructions/literature along with all the possible adverse reactions/side effects for all the Medicines you take and that have been prescribed to you. Take any new Medicines after you have completely understood and accpet all  the possible adverse reactions/side effects.   Please note  You were cared for by a hospitalist during your hospital stay. If you have any questions about your discharge medications or the care you received while you were in the hospital after you are discharged, you can call the unit and asked to speak with the hospitalist on call if the hospitalist that  took care of you is not available. Once you are discharged, your primary care physician will handle any further medical issues. Please note that NO REFILLS for any discharge medications will be authorized once you are discharged, as it is imperative that you return to your primary care physician (or establish a relationship with a primary care physician if you do not have one) for your aftercare needs so that they can reassess your need for medications and monitor your lab values.     Today   Shortness of breath much improved since admission.  Patient denies any chest pain or dizziness or palpitations.  Will discharge home with home health services today.  VITAL SIGNS:  Blood pressure 130/67, pulse 85, temperature 97.9 F (36.6 C), temperature source Oral, resp. rate 18, height 5\' 6"  (1.676 m), weight 64.1 kg (141 lb 4.8 oz), SpO2 100 %.  I/O:    Intake/Output Summary (Last 24 hours) at 08/26/2017 1356 Last data filed at 08/25/2017 1825 Gross per 24 hour  Intake 120 ml  Output -  Net 120 ml    PHYSICAL EXAMINATION:   GENERAL:  84 y.o.-year-old patient lying in bed in no acute distress.  EYES: Pupils equal, round, reactive to light and accommodation. No scleral icterus. Extraocular muscles intact.  HEENT: Head atraumatic, normocephalic. Oropharynx and nasopharynx clear.  NECK:  Supple, no jugular venous distention. No thyroid enlargement, no tenderness.  LUNGS: Normal breath sounds bilaterally, no wheezing, bibasilar rales, rhonchi. No use of accessory muscles of respiration.  CARDIOVASCULAR: S1, S2 normal. Loud III/VI SEM at LSB, NO rubs, or gallops.  ABDOMEN: Soft, nontender, nondistended. Bowel sounds present. No organomegaly or mass.  EXTREMITIES: No cyanosis, clubbing or edema b/l.    NEUROLOGIC: Cranial nerves II through XII are intact. No focal Motor or sensory deficits b/l.   PSYCHIATRIC: The patient is alert and oriented x 3.  SKIN: No obvious rash, lesion, or ulcer.  DATA  REVIEW:   CBC Recent Labs  Lab 08/23/17 0653  WBC 6.0  HGB 9.8*  HCT 29.7*  PLT 277    Chemistries  Recent Labs  Lab 08/22/17 1307  08/24/17 0528  NA  --    < > 143  K  --    < > 4.2  CL  --    < > 105  CO2  --    < > 33*  GLUCOSE  --    < > 125*  BUN  --    < > 17  CREATININE  --    < > 0.87  CALCIUM  --    < > 8.8*  MG  --   --  1.5*  AST 29  --   --   ALT 11*  --   --   ALKPHOS 49  --   --   BILITOT 2.7*  --   --    < > = values in this interval not displayed.    Cardiac Enzymes Recent Labs  Lab 08/22/17 1104  TROPONINI 0.03*    Microbiology Results  No results found for this or any previous visit.  RADIOLOGY:  No results found.    Management plans discussed with the patient, family and they are in agreement.  CODE STATUS:     Code Status Orders  (From admission, onward)        Start     Ordered   08/22/17 1529  Do not attempt resuscitation (DNR)  Continuous    Question Answer Comment  In the event of cardiac or respiratory ARREST Do not call a "code blue"   In the event of cardiac or respiratory ARREST Do not perform Intubation, CPR, defibrillation or ACLS   In the event of cardiac or respiratory ARREST Use medication by any route, position, wound care, and other measures to relive pain and suffering. May use oxygen, suction and manual treatment of airway obstruction as needed for comfort.   Comments RN may pronounce      08/22/17 1528      TOTAL TIME TAKING CARE OF THIS PATIENT: 40 minutes.    Houston Siren M.D on 08/26/2017 at 1:56 PM  Between 7am to 6pm - Pager - [phone]  After 6pm go to [email or web address] - Social research officer, government  [facility]  Office  [phone]  CC: Primary care physician; Jaclyn Shaggy, MD

## 2017-08-26 NOTE — Telephone Encounter (Signed)
Patient currently admitted at this time. 

## 2017-08-26 NOTE — Care Management (Signed)
Spoke with grandson, [employer]. Would like Home Health services in the home. Discussed agencies. Will update [facility] representative.  Also, will have palliative in the home. Discharge to home today per Dr. Cherlynn KaiserSainani. Gwenette GreetBrenda S Axtyn Woehler RN MSN CCM Care Management [phone]

## 2017-08-26 NOTE — Telephone Encounter (Signed)
-----   Message from Coralee RudSabrina F Gilley sent at 08/26/2017 11:40 AM EDT ----- Regarding: tcm/ph 09/05/2017 9:40 Dr Mariah Millinggollan

## 2017-08-26 NOTE — Progress Notes (Signed)
Progress Note  Patient Name: Carolyn Clarke Date of Encounter: 08/26/2017  Primary Cardiologist: Julien Nordmannimothy Nickcole Bralley, MD   Subjective   No complaints this morning, would like to go home Denies any significant shortness of breath or chest discomfort Discussions over the weekend, his indicated she does not want surgical intervention on her aortic valve    Inpatient Medications    Scheduled Meds: . enoxaparin (LOVENOX) injection  40 mg Subcutaneous Q24H  . feeding supplement (ENSURE ENLIVE)  237 mL Oral BID BM  . furosemide  40 mg Oral Daily  . potassium chloride  20 mEq Oral BID  . sodium chloride flush  3 mL Intravenous Q12H   Continuous Infusions:  PRN Meds: acetaminophen **OR** acetaminophen, ondansetron **OR** ondansetron (ZOFRAN) IV, sodium chloride flush   Vital Signs    Vitals:   08/25/17 0757 08/25/17 1544 08/26/17 0350 08/26/17 0808  BP: 108/64 (!) 101/58 118/63 130/67  Pulse: 81 84 85 85  Resp: 19 19 18 18   Temp: 98 F (36.7 C) 98 F (36.7 C) 98.4 F (36.9 C) 97.9 F (36.6 C)  TempSrc: Oral Oral Oral Oral  SpO2: 97% 99% 98% 100%  Weight:   141 lb 4.8 oz (64.1 kg)   Height:        Intake/Output Summary (Last 24 hours) at 08/26/2017 1003 Last data filed at 08/25/2017 1825 Gross per 24 hour  Intake 360 ml  Output 525 ml  Net -165 ml   Filed Weights   08/24/17 0339 08/25/17 0346 08/26/17 0350  Weight: 140 lb (63.5 kg) 143 lb 4.8 oz (65 kg) 141 lb 4.8 oz (64.1 kg)    Telemetry    Normal sinus rhythm- Personally Reviewed  ECG    Not done today- Personally Reviewed  Physical Exam   Constitutional:  oriented to person, place, and time. No distress.  HENT:  Head: Normocephalic and atraumatic.  Eyes:  no discharge. No scleral icterus.  Neck: Normal range of motion. Neck supple. No JVD present.  Cardiovascular: Normal rate, regular rhythm, normal heart sounds and intact distal pulses. Exam reveals no gallop and no friction rub. No edema 3 out of 6  crescendo decrescendo systolic murmur which is late peaking  Pulmonary/Chest: Effort normal and breath sounds normal. No stridor. No respiratory distress.  no wheezes.  no rales.  no tenderness.  Abdominal: Soft.  no distension.  no tenderness.  Musculoskeletal: Normal range of motion.  no  tenderness or deformity.  Neurological:  normal muscle tone. Coordination normal. No atrophy Skin: Skin is warm and dry. No rash noted. not diaphoretic.  Psychiatric:  normal mood and affect. behavior is normal. Thought content normal.      Labs    Chemistry Recent Labs  Lab 08/22/17 1104 08/22/17 1307 08/23/17 0653 08/24/17 0528  NA 144  --  142 143  K 3.7  --  3.3* 4.2  CL 113*  --  108 105  CO2 23  --  28 33*  GLUCOSE 115*  --  137* 125*  BUN 19  --  19 17  CREATININE 0.79  --  0.84 0.87  CALCIUM 8.9  --  8.6* 8.8*  PROT  --  6.0*  --   --   ALBUMIN  --  3.4*  --   --   AST  --  29  --   --   ALT  --  11*  --   --   ALKPHOS  --  49  --   --  BILITOT  --  2.7*  --   --   GFRNONAA >60  --  60* 57*  GFRAA >60  --  >60 >60  ANIONGAP 8  --  6 5     Hematology Recent Labs  Lab 08/22/17 1104 08/23/17 0653  WBC 3.7 6.0  RBC 2.89* 3.17*  3.13*  HGB 8.9* 9.8*  HCT 27.4* 29.7*  MCV 94.7 93.5  MCH 30.6 30.9  MCHC 32.3 33.0  RDW 21.0* 18.0*  PLT 244 277    Cardiac Enzymes Recent Labs  Lab 08/22/17 1104  TROPONINI 0.03*   No results for input(s): TROPIPOC in the last 168 hours.   BNP Recent Labs  Lab 08/22/17 1307  BNP 3,354.0*     DDimer No results for input(s): DDIMER in the last 168 hours.   Radiology    No results found.  Cardiac Studies   Echocardiogram was done today and personally reviewed by me:  Study Conclusions  - Left ventricle: The cavity size was normal. There was mild   concentric hypertrophy. Systolic function was mildly reduced. The   estimated ejection fraction was in the range of 45% to 50%.   Probable hypokinesis of the lateral and  inferolateral myocardium.   Features are consistent with a pseudonormal left ventricular   filling pattern, with concomitant abnormal relaxation and   increased filling pressure (grade 2 diastolic dysfunction). - Aortic valve: There was critical stenosis. Mean gradient (S): 55   mm Hg. Valve area (VTI): 0.31 cm^2. - Mitral valve: There was moderate regurgitation. - Left atrium: The atrium was moderately dilated. - Pericardium, extracardiac: A trivial pericardial effusion was   identified. There was a right pleural effusion. There was a left   pleural effusion.  Patient Profile     84 y.o. female with past medical history of anemia who has not seen her doctor in about 30 years.  She presented with shortness of breath and was found to have congestive heart failure and significant volume overload.  Assessment & Plan    1.  Acute combined systolic/diastolic heart failure  in the setting of critical aortic stenosis:  Blood pressure running low, will hold off on ACE inhibitor, ARB Continue Lasix 40 daily po at discharge with KCL 20 daily Follow-up in clinic  2.  Severe aortic stenosis:  Discussions over the weekend with cardiology Family has indicated they are not ready to pursue surgical intervention on her aortic valve She is aware this would likely progress with worsening shortness of breath, chest tightness, episodes of syncope Notes indicating hospice has been consulted and they have seen the patient this morning -She would likely benefit from home with hospice -She is discussed with nursing will need Lasix 40 mg po daily at discharge    Total encounter time greater than 25 minutes.  Greater than 50% was spent in counseling and coordination of care with the patient.  For questions or updates, please contact [facility] Please consult [email or web address] for contact info under Cardiology/STEMI.      Signed, Julien Nordmann, MD  08/26/2017, 10:03 AM

## 2017-08-26 NOTE — Care Management Note (Signed)
Case Management Note  Patient Details  Name: Carolyn Clarke MRN: [id number] Date of Birth: [birth year]  Subjective/Objective:   Admitted to [facility] with the diagnosis of Congestive Heart Failure. Lives with husband, [name]. Goddaughter is [name] [phone]). Seen Dr, Arlana Pouchate prior to this admission.[facility] at [facility], States she has never had any nursing services in the home. States she has no medical equipment in the home. Attempted to discuss home services. Wants goddaughter called, [name] [phone]). Left voice mail                 Action/Plan: Will discuss Hospice services in the home with Goddaughter as soon as she calls back   Expected Discharge Date:                  Expected Discharge Plan:     In-House Referral:     Discharge planning Services     Post Acute Care Choice:    Choice offered to:     DME Arranged:    DME Agency:     HH Arranged:    HH Agency:     Status of Service:     If discussed at [employer] of [employer] Meetings, dates discussed:    Additional Comments:  Gwenette GreetBrenda S Lakshya Mcgillicuddy, RN MSN CCM Care Management [phone] 08/26/2017, 9:04 AM

## 2017-08-27 NOTE — Telephone Encounter (Signed)
Patient [facility] on 08/26/[facility].   Patient understands to follow up with provider ? [facility]  Patient understands discharge instructions? Yes   Patient understands medications and regiment? Yes  Patient understands to bring all medications to this visit? Yes  Patient wanted me to call her God-daughter, Dondra SpryGail as well. I did, and Dondra SpryGail verbalized understanding appointment date and time and medications.

## 2017-08-28 DIAGNOSIS — I5040 Unspecified combined systolic (congestive) and diastolic (congestive) heart failure: Secondary | ICD-10-CM

## 2017-08-29 ENCOUNTER — Telehealth: Payer: Self-pay | Admitting: Cardiovascular Disease

## 2017-08-29 NOTE — Telephone Encounter (Signed)
Thayer OhmChris verbalized understanding that Dr Mariah MillingGollan said ok for the nursing and PT orders as were given.

## 2017-08-29 NOTE — Telephone Encounter (Signed)
Routing to Dr Mariah MillingGollan to confirm if ok.  Patient is in process of establishing with  PCP. Patient has upcoming appointment with Dr Mariah MillingGollan on 09/05/17.

## 2017-08-29 NOTE — Telephone Encounter (Signed)
Amy pope now calling from [facility] also asking for a verbal for nursing   1 x for 1 week  2 x for 2 weeks 1 x for 3 weeks  1 every other for about 3 weeks   She state we can call Thayer OhmChris back with both verbal orders   Please advise

## 2017-08-29 NOTE — Telephone Encounter (Signed)
Carolyn Clarke with [facility] asking for a verbal for PT 2 x a week for 4 weeks  Please call back

## 2017-08-29 NOTE — Telephone Encounter (Signed)
That would be fine Thanks TGollan

## 2017-09-04 NOTE — Progress Notes (Signed)
Cardiology Office Note  Date:  09/05/2017   ID:  Carolyn Clarke, DOB 1926/07/28, MRN [id number]  PCP:  Jaclyn Shaggy, MD   Chief Complaint  Patient presents with  . other    Follow up from [facility]; CHF. Meds reviewed by the pt.'s bottles. Pt. c/o shortness of breath & LE edema.     HPI:  84 y.o. female with past medical history of  anemia  severe aortic valve stenosis Presenting to the hospital with  shortness of breath  congestive heart failure  Presenting for follow up of severe aortic valve stenosis, pulmonary edema and leg edema  Weight stable at home, 145 pounds on her scale, 139 pounds on our scale Still with significant leg swelling Reports her shortness of breath is improving Not very ambulatory, presents in a wheelchair Reports having swelling up into her thighs  Drinks apple juice 1.5 gallons a day " Stopped yesterday" Drinks 8 large glasses of water a day and 1 cup of coffee  Seen by Dr. Arlana Pouch since her discharge  Lab work reviewed from in the hospital Normal iron studies Felt to have anemia of chronic disease  Blood pressure still running low but she denies any orthostasis symptoms Husband not present today Friend is with her for transportation  EKG personally reviewed by myself on todays visit Shows normal sinus rhythm with rate 81 bpm poor R wave progression through the anterior precordial leads concerning for old anterior MI, unable to exclude old inferior MI  Echocardiogram August 23, 2017 Left ventricle: The cavity size was normal. There was mild concentric hypertrophy. Systolic function was mildly reduced. The estimated ejection fraction was in the range of 45% to 50%.Probable hypokinesis of the lateral and inferolateral myocardium.   Features are consistent with a pseudonormal left ventricular filling pattern, with concomitant abnormal relaxation and increased filling pressure (grade 2 diastolic dysfunction). - Aortic valve: There was critical stenosis.  Mean gradient (S): 55 mm Hg. Valve area (VTI): 0.31 cm^2. - Mitral valve: There was moderate regurgitation. - Left atrium: The atrium was moderately dilated. - Pericardium, extracardiac: A trivial pericardial effusion was identified. There was a right pleural effusion. There was a left  pleural effusion.   PMH:   has a past medical history of Anemia, Aortic valve stenosis, CHF (congestive heart failure) (HCC), Hypokalemia, and Kidney stone.  PSH:   History reviewed. No pertinent surgical history.  Current Outpatient Medications  Medication Sig Dispense Refill  . furosemide (LASIX) 40 MG tablet Take 1 tablet (40 mg total) by mouth daily. 90 tablet 4  . Multiple Vitamin (MULTIVITAMIN) tablet Take 1 tablet by mouth daily.    . potassium chloride SA (K-DUR,KLOR-CON) 20 MEQ tablet Take 1 tablet (20 mEq total) by mouth daily. 90 tablet 4   No current facility-administered medications for this visit.      Allergies:   Patient has no known allergies.   Social History:  The patient  reports that she has never smoked. She has never used smokeless tobacco. She reports that she does not drink alcohol or use drugs.   Family History:   family history includes Heart failure in her mother; Hypertension in her father.    Review of Systems: Review of Systems  Constitutional: Negative.   Respiratory: Positive for shortness of breath.   Cardiovascular: Positive for leg swelling.  Gastrointestinal: Negative.   Musculoskeletal: Negative.   Neurological: Negative.   Psychiatric/Behavioral: Negative.   All other systems reviewed and are negative.    PHYSICAL  EXAM: VS:  BP (!) 100/54 (BP Location: Left Arm, Patient Position: Sitting, Cuff Size: Normal)   Pulse 81   Ht  (1.676 m)   Wt 139 lb (63 kg)   BMI 22.44 kg/m  , BMI Body mass index is 22.44 kg/m. GEN: Well nourished, well developed, in no acute distress  HEENT: normal  Neck: no JVD, carotid bruits, or masses Cardiac: RRR; no  murmurs, rubs, or gallops, 2+ pitting edema through the thighs, woody Respiratory:  clear to auscultation bilaterally, normal work of breathing Dullness at the bases bilaterally GI: soft, nontender, nondistended, + BS MS: no deformity or atrophy  Skin: warm and dry, no rash Neuro:  Strength and sensation are intact Psych: euthymic mood, full affect    Recent Labs: 08/22/2017: ALT 11; B Natriuretic Peptide 3,354.0; TSH 3.899 08/23/2017: Hemoglobin 9.8; Platelets 277 08/24/2017: BUN 17; Creatinine, Ser 0.87; Magnesium 1.5; Potassium 4.2; Sodium 143    Lipid Panel No results found for: CHOL, HDL, LDLCALC, TRIG    Wt Readings from Last 3 Encounters:  09/05/17 139 lb (63 kg)  08/26/17 141 lb 4.8 oz (64.1 kg)       ASSESSMENT AND PLAN:  Chronic combined systolic and diastolic CHF, NYHA class 2 and ACA/AHA stage C (HCC) - Plan: EKG 12-Lead Reports she is drinking 1.5 gallons of apple juice per day and 8 large glasses of water a day with some coffee Stressed the importance that she stop drinking apple juice and drink half the amount of water No medication changes made Lab work through primary care   aortic valve stenosis - Plan: EKG 12-Lead Critical aortic valve stenosis She and her husband previously declined intervention such as TAVR  had long discussions with her in the hospital  Anemia, unspecified type Chronic anemia, Iron studies normal on review Felt to be anemia of chronic disease Could consider referral to hematology, will defer to primary care  Leg edema Secondary to anemia and heart failure Long discussion with her above concerning limiting her fluid intake  Chronic bilateral pleural effusions Secondary to heart failure and anemia Shortness of breath has improved Again discussed with her limitations needed for fluid intake   Total encounter time more than 45 minutes  Greater than 50% was spent in counseling and coordination of care with the  patient   Disposition:   F/U  6 months   Orders Placed This Encounter  Procedures  . EKG 12-Lead     Signed, Dossie Arbour, M.D., Ph.D. 09/05/2017  [facility] [place], [place] [phone]

## 2017-09-05 ENCOUNTER — Ambulatory Visit: Payer: Medicare Other | Admitting: Cardiovascular Disease

## 2017-09-05 ENCOUNTER — Encounter: Payer: Self-pay | Admitting: Cardiovascular Disease

## 2017-09-05 VITALS — BP 100/54 | HR 81 | Ht 66.0 in | Wt 139.0 lb

## 2017-09-05 DIAGNOSIS — I5042 Chronic combined systolic (congestive) and diastolic (congestive) heart failure: Secondary | ICD-10-CM | POA: Diagnosis not present

## 2017-09-05 DIAGNOSIS — I350 Nonrheumatic aortic (valve) stenosis: Secondary | ICD-10-CM

## 2017-09-05 DIAGNOSIS — R600 Localized edema: Secondary | ICD-10-CM | POA: Diagnosis not present

## 2017-09-05 DIAGNOSIS — J90 Pleural effusion, not elsewhere classified: Secondary | ICD-10-CM | POA: Insufficient documentation

## 2017-09-05 DIAGNOSIS — D649 Anemia, unspecified: Secondary | ICD-10-CM | POA: Diagnosis not present

## 2017-09-05 MED ORDER — POTASSIUM CHLORIDE CRYS ER 20 MEQ PO TBCR: 20.0000 meq | EXTENDED_RELEASE_TABLET | Freq: Every day | ORAL | 4 refills | Status: AC

## 2017-09-05 MED ORDER — FUROSEMIDE 40 MG PO TABS: 40.0000 mg | ORAL_TABLET | Freq: Every day | ORAL | 4 refills | Status: AC

## 2017-09-05 NOTE — Patient Instructions (Signed)
Cut back on the water and apple juice   Medication Instructions:   No medication changes made  Labwork:  No new labs needed  Testing/Procedures:  No further testing at this time   Follow-Up: It was a pleasure seeing you in the office today. Please call us if you have new issues that need to be addressed before your next appt.  [phone]  Your physician wants you to follow-up in: 6 months.  You will receive a reminder letter in the mail two months in advance. If you don't receive a letter, please call our office to schedule the follow-up appointment.  If you need a refill on your cardiac medications before your next appointment, please call your pharmacy.  For educational health videos Log in to : [email or web address] Or : [email or web address], password : [email or web address]

## 2017-09-05 NOTE — Progress Notes (Deleted)
   Patient ID: Carolyn Clarke, female    DOB: 1926/10/22, 84 y.o.   MRN: [id number]  HPI  Carolyn Clarke is a 84 y/o female with a history of   Echo report from 08/23/17 reviewed and showed an EF of 45-50% along with critical AS and moderate MR. Also noted was a right and left pleural effusion.   Admitted 08/22/17 due to acute on chronic HF. Initially needed IV lasix and then transitioned to oral diuretics. Net loss of ~ 8L during hospitalization. Cardiology consult obtained. Elevated troponin thought to be due to demand ischemia. Critical aortic stenosis noted on echocardiogram but patient defers further workup/treatment of this. Was discharged after 4 days with home health and palliative care at home.   She presents today for her initial visit with a chief complaint of   Review of Systems    Physical Exam    Assessment & Plan:  1: Chronic heart failure with preserved ejection fraction- - NYHA class - saw cardiology Carolyn Clarke) 09/05/17 - BNP on 08/22/17 was 3354.0  2: Critical aortic stenosis- - not on an ACE-I - patient/family do not want further treatment of this  3: Hypotension- - BMP on 08/24/17 reviewed and showed sodium 143, potassium 4.2 and GFR >60  4: Anemia- - hemoglobin 08/23/17 was 9.8

## 2017-09-06 ENCOUNTER — Ambulatory Visit: Payer: Medicare Other | Admitting: Family

## 2017-09-25 NOTE — Progress Notes (Signed)
Patient ID: Carolyn Clarke, female    DOB: 1927-02-06, 84 y.o.   MRN: [id number]  HPI  Carolyn Clarke is a 84 y/o female with a history of CKD, anemia, aortic valve stenosis, hypokalemia and chronic heart failure.   Echo report from 08/23/17 reviewed and showed an EF of 45-50% along with critical AS and moderate MR. Also noted was a right and left pleural effusion.   Admitted 08/22/17 due to acute on chronic HF. Initially needed IV lasix and then transitioned to oral diuretics. Net loss of ~ 8L during hospitalization. Cardiology consult obtained. Elevated troponin thought to be due to demand ischemia. Critical aortic stenosis noted on echocardiogram but patient defers further workup/treatment of this. Was discharged after 4 days with home health and palliative care at home.   She presents today for her initial visit with a chief complaint of moderate fatigue upon minimal exertion. She describes this as chronic in nature having been present for several years. She has associated shortness of breath, light-headedness and pedal edema along with this. She denies any difficulty sleeping, abdominal distention, palpitations, chest pain, cough or weight gain.   Past Medical History:  Diagnosis Date  . Anemia   . Aortic valve stenosis   . CHF (congestive heart failure) (HCC)   . Hypokalemia   . Kidney stone    History reviewed. No pertinent surgical history. Family History  Problem Relation Age of Onset  . Heart failure Mother   . Hypertension Father    Social History   Tobacco Use  . Smoking status: Never Smoker  . Smokeless tobacco: Never Used  Substance Use Topics  . Alcohol use: Never    Frequency: Never   No Known Allergies Prior to Admission medications   Medication Sig Start Date End Date Taking? Authorizing Provider  furosemide (LASIX) 40 MG tablet Take 1 tablet (40 mg total) by mouth daily. 09/05/17  Yes Antonieta Iba, MD  Multiple Vitamin (MULTIVITAMIN) tablet Take 1 tablet by  mouth daily.   Yes [provider]  potassium chloride SA (K-DUR,KLOR-CON) 20 MEQ tablet Take 1 tablet (20 mEq total) by mouth daily. 09/05/17  Yes Antonieta Iba, MD   Review of Systems  Constitutional: Positive for fatigue.  HENT: Negative for congestion, postnasal drip and sore throat.   Eyes: Negative.   Respiratory: Positive for shortness of breath. Negative for cough and chest tightness.   Cardiovascular: Positive for leg swelling. Negative for chest pain and palpitations.  Gastrointestinal: Negative for abdominal distention and abdominal pain.  Endocrine: Negative.   Genitourinary: Negative.   Musculoskeletal: Negative for back pain and neck pain.  Skin: Negative.   Allergic/Immunologic: Negative.   Neurological: Positive for light-headedness. Negative for dizziness.  Hematological: Negative for adenopathy. Does not bruise/bleed easily.  Psychiatric/Behavioral: Negative for dysphoric mood and sleep disturbance (sleeping flat in bed). The patient is not nervous/anxious.     Vitals:   09/26/17 1307  BP: (!) 112/57  Pulse: 80  Resp: 18  SpO2: 99%  Weight: 143 lb 6 oz (65 kg)  Height:  (1.676 m)   Wt Readings from Last 3 Encounters:  09/26/17 143 lb 6 oz (65 kg)  09/05/17 139 lb (63 kg)  08/26/17 141 lb 4.8 oz (64.1 kg)   Lab Results  Component Value Date   CREATININE 0.87 08/24/2017   CREATININE 0.84 08/23/2017   CREATININE 0.79 08/22/2017   Physical Exam  Constitutional: She is oriented to person, place, and time. She appears well-developed  and well-nourished.  HENT:  Head: Normocephalic and atraumatic.  Neck: Normal range of motion. Neck supple. No JVD present.  Cardiovascular: Normal rate and regular rhythm.  Pulmonary/Chest: Effort normal. No respiratory distress. She has no wheezes. She has no rales.  Abdominal: Soft. She exhibits no distension.  Musculoskeletal:       Right lower leg: She exhibits edema (3+ pitting edema). She exhibits no  tenderness.       Left lower leg: She exhibits edema (3+ pitting edema). She exhibits no tenderness.  Neurological: She is alert and oriented to person, place, and time.  Skin: Skin is warm and dry.  Psychiatric: She has a normal mood and affect. Her behavior is normal.  Nursing note and vitals reviewed.  Assessment & Plan:  1: Chronic heart failure with preserved ejection fraction- - NYHA class III - moderately fluid overloaded with pedal edema - weighing daily and says that her home weight has been stable. Instructed to call for an overnight weight gain of >2 pounds or a weekly weight gain of >5 pounds - not adding salt and sometimes reads food labels. Discussed the importance of closely following a  sodium diet and written dietary information was given to her about this - says that she was drinking "too much fluids" but has been trying to decrease consumption of fluids per cardiology recommendation - saw cardiology Carolyn Clarke) 09/05/17 - BNP on 08/22/17 was 3354.0  2: Critical aortic stenosis- - not on an ACE-I - patient/family do not want further treatment of this  3: Hypotension- - BP looks good today - BMP on 08/24/17 reviewed and showed sodium 143, potassium 4.2 and GFR >60  4: Anemia- - hemoglobin 08/23/17 was 9.8  5: Lymphedema- - not elevating legs much because her husband complains about having to walk around the recliner when its reclined.  - suggested she put her legs up on the couch then or get an ottoman - unable to put TED hose on due to decreased grip in hands - discussed lymphapress compression boots but patient and caregiver aren't too sure they would want to pursue this  Medication bottles were reviewed.  Return in 1 month or sooner for any questions/problems before then.

## 2017-09-26 ENCOUNTER — Ambulatory Visit: Payer: Medicare Other | Attending: Family | Admitting: Family

## 2017-09-26 ENCOUNTER — Encounter: Payer: Self-pay | Admitting: Family

## 2017-09-26 VITALS — BP 112/57 | HR 80 | Resp 18 | Ht 66.0 in | Wt 143.4 lb

## 2017-09-26 DIAGNOSIS — I959 Hypotension, unspecified: Secondary | ICD-10-CM | POA: Insufficient documentation

## 2017-09-26 DIAGNOSIS — J90 Pleural effusion, not elsewhere classified: Secondary | ICD-10-CM | POA: Insufficient documentation

## 2017-09-26 DIAGNOSIS — I5032 Chronic diastolic (congestive) heart failure: Secondary | ICD-10-CM

## 2017-09-26 DIAGNOSIS — I890 Lymphedema, not elsewhere classified: Secondary | ICD-10-CM

## 2017-09-26 DIAGNOSIS — I350 Nonrheumatic aortic (valve) stenosis: Secondary | ICD-10-CM

## 2017-09-26 DIAGNOSIS — N189 Chronic kidney disease, unspecified: Secondary | ICD-10-CM | POA: Diagnosis not present

## 2017-09-26 DIAGNOSIS — I950 Idiopathic hypotension: Secondary | ICD-10-CM

## 2017-09-26 DIAGNOSIS — D631 Anemia in chronic kidney disease: Secondary | ICD-10-CM | POA: Diagnosis not present

## 2017-09-26 DIAGNOSIS — Z8249 Family history of ischemic heart disease and other diseases of the circulatory system: Secondary | ICD-10-CM | POA: Insufficient documentation

## 2017-09-26 DIAGNOSIS — Z79899 Other long term (current) drug therapy: Secondary | ICD-10-CM | POA: Insufficient documentation

## 2017-09-26 DIAGNOSIS — R5383 Other fatigue: Secondary | ICD-10-CM | POA: Diagnosis present

## 2017-09-26 DIAGNOSIS — D649 Anemia, unspecified: Secondary | ICD-10-CM

## 2017-09-26 NOTE — Patient Instructions (Signed)
Continue weighing daily and call for an overnight weight gain of > 2 pounds or a weekly weight gain of >5 pounds. 

## 2017-10-28 ENCOUNTER — Encounter: Payer: Self-pay | Admitting: Family

## 2017-10-28 ENCOUNTER — Ambulatory Visit: Payer: Medicare Other | Attending: Family | Admitting: Family

## 2017-10-28 VITALS — BP 113/66 | HR 79 | Resp 18 | Ht 67.0 in | Wt 151.0 lb

## 2017-10-28 DIAGNOSIS — I890 Lymphedema, not elsewhere classified: Secondary | ICD-10-CM | POA: Insufficient documentation

## 2017-10-28 DIAGNOSIS — I350 Nonrheumatic aortic (valve) stenosis: Secondary | ICD-10-CM | POA: Diagnosis not present

## 2017-10-28 DIAGNOSIS — I5032 Chronic diastolic (congestive) heart failure: Secondary | ICD-10-CM | POA: Diagnosis not present

## 2017-10-28 DIAGNOSIS — I959 Hypotension, unspecified: Secondary | ICD-10-CM | POA: Diagnosis not present

## 2017-10-28 DIAGNOSIS — Z79899 Other long term (current) drug therapy: Secondary | ICD-10-CM | POA: Diagnosis not present

## 2017-10-28 DIAGNOSIS — Z09 Encounter for follow-up examination after completed treatment for conditions other than malignant neoplasm: Secondary | ICD-10-CM | POA: Insufficient documentation

## 2017-10-28 DIAGNOSIS — Z8249 Family history of ischemic heart disease and other diseases of the circulatory system: Secondary | ICD-10-CM | POA: Insufficient documentation

## 2017-10-28 DIAGNOSIS — D649 Anemia, unspecified: Secondary | ICD-10-CM | POA: Insufficient documentation

## 2017-10-28 DIAGNOSIS — E876 Hypokalemia: Secondary | ICD-10-CM | POA: Diagnosis not present

## 2017-10-28 DIAGNOSIS — J90 Pleural effusion, not elsewhere classified: Secondary | ICD-10-CM | POA: Diagnosis not present

## 2017-10-28 DIAGNOSIS — Z87442 Personal history of urinary calculi: Secondary | ICD-10-CM | POA: Insufficient documentation

## 2017-10-28 DIAGNOSIS — N189 Chronic kidney disease, unspecified: Secondary | ICD-10-CM | POA: Insufficient documentation

## 2017-10-28 DIAGNOSIS — I950 Idiopathic hypotension: Secondary | ICD-10-CM

## 2017-10-28 NOTE — Progress Notes (Signed)
Patient ID: Carolyn Clarke, female    DOB: December 27, 1926, 84 y.o.   MRN: [id number]  HPI  Carolyn Clarke is a 84 y/o female with a history of CKD, anemia, aortic valve stenosis, hypokalemia and chronic heart failure.   Echo report from 08/23/17 reviewed and showed an EF of 45-50% along with critical AS and moderate MR. Also noted was a right and left pleural effusion.   Admitted 08/22/17 due to acute on chronic HF. Initially needed IV lasix and then transitioned to oral diuretics. Net loss of ~ 8L during hospitalization. Cardiology consult obtained. Elevated troponin thought to be due to demand ischemia. Critical aortic stenosis noted on echocardiogram but patient defers further workup/treatment of this. Was discharged after 4 days with home health and palliative care at home.   She presents today for a follow-up visit with a chief complaint of moderate fatigue upon minimal exertion. She describes this as chronic in nature having been present for several years. She has associated shortness of breath, light-headedness, pedal edema and gradual weight gain along with this. She denies any difficulty sleeping, abdominal distention, palpitations, chest pain or cough.   Past Medical History:  Diagnosis Date  . Anemia   . Aortic valve stenosis   . CHF (congestive heart failure) (HCC)   . Hypokalemia   . Kidney stone    No past surgical history on file. Family History  Problem Relation Age of Onset  . Heart failure Mother   . Hypertension Father    Social History   Tobacco Use  . Smoking status: Never Smoker  . Smokeless tobacco: Never Used  Substance Use Topics  . Alcohol use: Never    Frequency: Never   No Known Allergies  Prior to Admission medications   Medication Sig Start Date End Date Taking? Authorizing Provider  furosemide (LASIX) 40 MG tablet Take 1 tablet (40 mg total) by mouth daily. 09/05/17  Yes Antonieta Iba, MD  Multiple Vitamin (MULTIVITAMIN) tablet Take 1 tablet by mouth  daily.   Yes [provider]  potassium chloride SA (K-DUR,KLOR-CON) 20 MEQ tablet Take 1 tablet (20 mEq total) by mouth daily. 09/05/17  Yes Antonieta Iba, MD    Review of Systems  Constitutional: Positive for fatigue.  HENT: Negative for congestion, postnasal drip and sore throat.   Eyes: Negative.   Respiratory: Positive for shortness of breath. Negative for cough and chest tightness.   Cardiovascular: Positive for leg swelling. Negative for chest pain and palpitations.  Gastrointestinal: Negative for abdominal distention and abdominal pain.  Endocrine: Negative.   Genitourinary: Negative.   Musculoskeletal: Negative for back pain and neck pain.       Left hand swelling  Skin: Negative.   Allergic/Immunologic: Negative.   Neurological: Positive for light-headedness (sometimes with sudden positions changes). Negative for dizziness.  Hematological: Negative for adenopathy. Does not bruise/bleed easily.  Psychiatric/Behavioral: Negative for dysphoric mood and sleep disturbance (sleeping flat in bed). The patient is not nervous/anxious.    Vitals:   10/28/17 1307  BP: 113/66  Pulse: 79  Resp: 18  SpO2: 99%  Weight: 151 lb (68.5 kg)  Height: 5\' 7"  (1.702 m)   Wt Readings from Last 3 Encounters:  10/28/17 151 lb (68.5 kg)  09/26/17 143 lb 6 oz (65 kg)  09/05/17 139 lb (63 kg)   Lab Results  Component Value Date   CREATININE 0.87 08/24/2017   CREATININE 0.84 08/23/2017   CREATININE 0.79 08/22/2017   Physical Exam  Constitutional:  She is oriented to person, place, and time. She appears well-developed and well-nourished.  HENT:  Head: Normocephalic and atraumatic.  Neck: Normal range of motion. Neck supple. No JVD present.  Cardiovascular: Normal rate and regular rhythm.  Pulmonary/Chest: Effort normal. No respiratory distress. She has no wheezes. She has no rales.  Abdominal: Soft. She exhibits no distension.  Musculoskeletal:       Right lower leg: She  exhibits edema (3+ pitting edema). She exhibits no tenderness.       Left lower leg: She exhibits edema (3+ pitting edema). She exhibits no tenderness.  Neurological: She is alert and oriented to person, place, and time.  Skin: Skin is warm and dry.  Psychiatric: She has a normal mood and affect. Her behavior is normal.  Nursing note and vitals reviewed.  Assessment & Plan:  1: Chronic heart failure with preserved ejection fraction- - NYHA class III - moderately fluid overloaded with pedal edema - hasn't been weighing daily and she was instructed to resume weighing daily so that she can call for an overnight weight gain of >2 pounds or a weekly weight gain of >5 pounds - weight up 8 pounds since she was last here but she says that she's eating better than she was  - not adding salt and sometimes reads food labels. Discussed the importance of closely following a 2000mg  sodium diet  - has decreased her fluid intake  - saw cardiology Mariah Milling([place]) 09/05/17 - BNP on 08/22/17 was 3354.0  2: Critical aortic stenosis- - not on an ACE-I - patient/family do not want further treatment of this  3: Hypotension- - BP looks good today - BMP on 08/24/17 reviewed and showed sodium 143, potassium 4.2 and GFR >60 - follows with PCP Arlana Pouch([place])  4: Lymphedema- - not elevating legs much because her husband complains about having to walk around the recliner when its reclined; encouraged her to elevate them as much as possible - unable to put TED hose on due to decreased grip in hands - limited in her ability to exercise due to her shortness of breath - discussed lymphapress compression boots again but patient states that she doesn't think she wants to use them  Medication bottles were reviewed.  Return in 3 months or sooner for any questions/problems before then.

## 2017-10-28 NOTE — Patient Instructions (Addendum)
Resume weighing daily and call for an overnight weight gain of > 2 pounds or a weekly weight gain of >5 pounds.  Begin taking your multivitamin daily.

## 2017-10-29 ENCOUNTER — Encounter: Payer: Self-pay | Admitting: Family

## 2017-12-16 ENCOUNTER — Encounter: Payer: Self-pay | Admitting: Emergency Medicine

## 2017-12-16 ENCOUNTER — Emergency Department: Payer: Medicare Other

## 2017-12-16 ENCOUNTER — Other Ambulatory Visit: Payer: Self-pay

## 2017-12-16 ENCOUNTER — Inpatient Hospital Stay
Admission: EM | Admit: 2017-12-16 | Discharge: 2018-01-05 | DRG: 291 | Disposition: E | Payer: Medicare Other | Attending: Internal Medicine | Admitting: Internal Medicine

## 2017-12-16 DIAGNOSIS — I350 Nonrheumatic aortic (valve) stenosis: Secondary | ICD-10-CM | POA: Diagnosis present

## 2017-12-16 DIAGNOSIS — Z66 Do not resuscitate: Secondary | ICD-10-CM | POA: Diagnosis not present

## 2017-12-16 DIAGNOSIS — N3000 Acute cystitis without hematuria: Secondary | ICD-10-CM

## 2017-12-16 DIAGNOSIS — R4182 Altered mental status, unspecified: Secondary | ICD-10-CM

## 2017-12-16 DIAGNOSIS — I471 Supraventricular tachycardia: Secondary | ICD-10-CM | POA: Diagnosis present

## 2017-12-16 DIAGNOSIS — I5023 Acute on chronic systolic (congestive) heart failure: Principal | ICD-10-CM | POA: Diagnosis present

## 2017-12-16 DIAGNOSIS — N183 Chronic kidney disease, stage 3 (moderate): Secondary | ICD-10-CM | POA: Diagnosis present

## 2017-12-16 DIAGNOSIS — R945 Abnormal results of liver function studies: Secondary | ICD-10-CM

## 2017-12-16 DIAGNOSIS — Z515 Encounter for palliative care: Secondary | ICD-10-CM | POA: Diagnosis not present

## 2017-12-16 DIAGNOSIS — J9601 Acute respiratory failure with hypoxia: Secondary | ICD-10-CM | POA: Diagnosis not present

## 2017-12-16 DIAGNOSIS — E876 Hypokalemia: Secondary | ICD-10-CM | POA: Diagnosis present

## 2017-12-16 DIAGNOSIS — G9341 Metabolic encephalopathy: Secondary | ICD-10-CM | POA: Diagnosis present

## 2017-12-16 DIAGNOSIS — K761 Chronic passive congestion of liver: Secondary | ICD-10-CM | POA: Diagnosis present

## 2017-12-16 DIAGNOSIS — N179 Acute kidney failure, unspecified: Secondary | ICD-10-CM | POA: Diagnosis present

## 2017-12-16 DIAGNOSIS — Z8249 Family history of ischemic heart disease and other diseases of the circulatory system: Secondary | ICD-10-CM

## 2017-12-16 DIAGNOSIS — R7989 Other specified abnormal findings of blood chemistry: Secondary | ICD-10-CM

## 2017-12-16 DIAGNOSIS — I509 Heart failure, unspecified: Secondary | ICD-10-CM

## 2017-12-16 LAB — BRAIN NATRIURETIC PEPTIDE

## 2017-12-16 LAB — COMPREHENSIVE METABOLIC PANEL
ALK PHOS: 77 U/L (ref 38–126)
ALT: 22 U/L (ref 0–44)
ANION GAP: 12 (ref 5–15)
AST: 51 U/L — ABNORMAL HIGH (ref 15–41)
Albumin: 3.7 g/dL (ref 3.5–5.0)
BUN: 37 mg/dL — ABNORMAL HIGH (ref 8–23)
CALCIUM: 9.5 mg/dL (ref 8.9–10.3)
CO2: 21 mmol/L — AB (ref 22–32)
CREATININE: 0.99 mg/dL (ref 0.44–1.00)
Chloride: 110 mmol/L (ref 98–111)
GFR, EST AFRICAN AMERICAN: 57 mL/min — AB (ref >60)
GFR, EST NON AFRICAN AMERICAN: 49 mL/min — AB (ref >60)
Glucose, Bld: 161 mg/dL — ABNORMAL HIGH (ref 70–99)
Potassium: 4.5 mmol/L (ref 3.5–5.1)
SODIUM: 143 mmol/L (ref 135–145)
Total Bilirubin: 4.5 mg/dL — ABNORMAL HIGH (ref 0.3–1.2)
Total Protein: 6.5 g/dL (ref 6.5–8.1)

## 2017-12-16 LAB — CBC
HCT: 37 % (ref 35.0–47.0)
HEMOGLOBIN: 12.2 g/dL (ref 12.0–16.0)
MCH: 32.2 pg (ref 26.0–34.0)
MCHC: 32.9 g/dL (ref 32.0–36.0)
MCV: 97.9 fL (ref 80.0–100.0)
PLATELETS: 152 10*3/uL (ref 150–440)
RBC: 3.78 MIL/uL — AB (ref 3.80–5.20)
RDW: 19.1 % — ABNORMAL HIGH (ref 11.5–14.5)
WBC: 11.5 10*3/uL — ABNORMAL HIGH (ref 3.6–11.0)

## 2017-12-16 LAB — URINALYSIS, COMPLETE (UACMP) WITH MICROSCOPIC
BILIRUBIN URINE: NEGATIVE
Glucose, UA: NEGATIVE mg/dL
Ketones, ur: NEGATIVE mg/dL
LEUKOCYTES UA: NEGATIVE
Nitrite: NEGATIVE
Protein, ur: NEGATIVE mg/dL
Specific Gravity, Urine: 1.008 (ref 1.005–1.030)
pH: 5 (ref 5.0–8.0)

## 2017-12-16 LAB — AMMONIA: Ammonia: 29 umol/L (ref 9–35)

## 2017-12-16 LAB — TROPONIN I: TROPONIN I: 0.09 ng/mL — AB (ref <0.03)

## 2017-12-16 MED ORDER — ENOXAPARIN SODIUM 40 MG/0.4ML ~~LOC~~ SOLN
40.0000 mg | SUBCUTANEOUS | Status: DC
  Filled 2017-12-16: qty 0.4

## 2017-12-16 MED ORDER — ONDANSETRON HCL 4 MG PO TABS: 4.0000 mg | ORAL_TABLET | Freq: Four times a day (QID) | ORAL | Status: DC | PRN

## 2017-12-16 MED ORDER — FUROSEMIDE 10 MG/ML IJ SOLN
40.0000 mg | Freq: Two times a day (BID) | INTRAMUSCULAR | Status: DC
  Administered 2017-12-16 – 2017-12-17 (×2): 40 mg via INTRAVENOUS
  Filled 2017-12-16 (×2): qty 4

## 2017-12-16 MED ORDER — ACETAMINOPHEN 650 MG RE SUPP: 650.0000 mg | Freq: Four times a day (QID) | RECTAL | Status: DC | PRN

## 2017-12-16 MED ORDER — ACETAMINOPHEN 325 MG PO TABS: 650.0000 mg | ORAL_TABLET | Freq: Four times a day (QID) | ORAL | Status: DC | PRN

## 2017-12-16 MED ORDER — ALBUTEROL SULFATE (2.5 MG/3ML) 0.083% IN NEBU: 2.5000 mg | INHALATION_SOLUTION | RESPIRATORY_TRACT | Status: DC | PRN

## 2017-12-16 MED ORDER — SODIUM CHLORIDE 0.9 % IV SOLN
1.0000 g | Freq: Once | INTRAVENOUS | Status: AC
  Administered 2017-12-16: 1 g via INTRAVENOUS
  Filled 2017-12-16: qty 10

## 2017-12-16 MED ORDER — SODIUM CHLORIDE 0.9% FLUSH
3.0000 mL | Freq: Two times a day (BID) | INTRAVENOUS | Status: DC
  Administered 2017-12-16 – 2017-12-18 (×5): 3 mL via INTRAVENOUS

## 2017-12-16 MED ORDER — SODIUM CHLORIDE 0.9 % IV BOLUS: 250.0000 mL | Freq: Once | INTRAVENOUS | Status: DC

## 2017-12-16 MED ORDER — FUROSEMIDE 10 MG/ML IJ SOLN
60.0000 mg | Freq: Once | INTRAMUSCULAR | Status: AC
  Administered 2017-12-16: 60 mg via INTRAVENOUS
  Filled 2017-12-16: qty 8

## 2017-12-16 MED ORDER — METOPROLOL TARTRATE 25 MG PO TABS
25.0000 mg | ORAL_TABLET | Freq: Two times a day (BID) | ORAL | Status: DC
  Administered 2017-12-16: 25 mg via ORAL
  Filled 2017-12-16 (×2): qty 1

## 2017-12-16 MED ORDER — ORAL CARE MOUTH RINSE
15.0000 mL | Freq: Two times a day (BID) | OROMUCOSAL | Status: DC
  Administered 2017-12-17: 15 mL via OROMUCOSAL

## 2017-12-16 MED ORDER — IPRATROPIUM-ALBUTEROL 0.5-2.5 (3) MG/3ML IN SOLN
3.0000 mL | Freq: Once | RESPIRATORY_TRACT | Status: DC
Start: 2017-12-16 — End: 2017-12-16

## 2017-12-16 MED ORDER — ONDANSETRON HCL 4 MG/2ML IJ SOLN: 4.0000 mg | Freq: Four times a day (QID) | INTRAMUSCULAR | Status: DC | PRN

## 2017-12-16 MED ORDER — POTASSIUM CHLORIDE CRYS ER 20 MEQ PO TBCR
20.0000 meq | EXTENDED_RELEASE_TABLET | Freq: Every day | ORAL | Status: DC
  Administered 2017-12-17 – 2017-12-18 (×2): 20 meq via ORAL
  Filled 2017-12-16 (×3): qty 1

## 2017-12-16 MED ORDER — POLYETHYLENE GLYCOL 3350 17 G PO PACK: 17.0000 g | PACK | Freq: Every day | ORAL | Status: DC | PRN

## 2017-12-16 NOTE — ED Provider Notes (Signed)
[facility] Emergency Department Provider Note    First MD Initiated Contact with Patient 12/23/2017 1043     (approximate)  I have reviewed the triage vital signs and the nursing notes.   HISTORY  Chief Complaint Altered Mental Status and Leg Swelling  Level V caveat:  Acute encephalopathy  HPI Carolyn Clarke is a 84 y.o. female history of anemia, aortic valve stenosis, CHF presents to the ER from home with confusion, worsening swelling up to her chest as well as urinary incontinence x3 days.  Patient seems confused as she is oriented to person and place.  Uncertain as to why she is here other than stating that she is having some weeping in her legs.   Denies any chest pain or shortness of breath at this time.  Reportedly does have cancer but unknown source or what type of cancer.   Past Medical History:  Diagnosis Date  . Anemia   . Aortic valve stenosis   . CHF (congestive heart failure) (HCC)   . Hypokalemia   . Kidney stone    Family History  Problem Relation Age of Onset  . Heart failure Mother   . Hypertension Father    History reviewed. No pertinent surgical history. Patient Active Problem List   Diagnosis Date Noted  . Hypotension 09/26/2017  . Lymphedema 09/26/2017  . Chronic bilateral pleural effusions 09/05/2017  . Malnutrition of moderate degree 08/23/2017  . CHF (congestive heart failure) (HCC) 08/22/2017  . Anemia 08/22/2017  . SVT (supraventricular tachycardia) (HCC) 08/22/2017  . Elevated bilirubin 08/22/2017  . Aortic valve stenosis 08/22/2017      Prior to Admission medications   Medication Sig Start Date End Date Taking? Authorizing Provider  furosemide (LASIX) 40 MG tablet Take 1 tablet (40 mg total) by mouth daily. 09/05/17  Yes Antonieta IbaGollan, Timothy J, MD  Multiple Vitamin (MULTIVITAMIN) tablet Take 1 tablet by mouth daily.   Yes [provider]  potassium chloride SA (K-DUR,KLOR-CON) 20 MEQ tablet Take 1 tablet (20  mEq total) by mouth daily. 09/05/17  Yes Antonieta IbaGollan, Timothy J, MD    Allergies Patient has no known allergies.    Social History Social History   Tobacco Use  . Smoking status: Never Smoker  . Smokeless tobacco: Never Used  Substance Use Topics  . Alcohol use: Never    Frequency: Never  . Drug use: Never    Review of Systems Patient denies headaches, rhinorrhea, blurry vision, numbness, shortness of breath, chest pain, edema, cough, abdominal pain, nausea, vomiting, diarrhea, dysuria, fevers, rashes or hallucinations unless otherwise stated above in HPI. ____________________________________________   PHYSICAL EXAM:  VITAL SIGNS: Vitals:   12/08/2017 1130 12/24/2017 1300  BP: 124/87 110/70  Pulse:    Resp: 18 18  Temp:    SpO2:      Constitutional: Alert, oriented x 2 Eyes: Conjunctivae are normal.  Head: Atraumatic. Nose: No congestion/rhinnorhea. Mouth/Throat: Mucous membranes are moist.   Neck: No stridor. Painless ROM.  Cardiovascular: Normal rate, regular rhythm. Holosystolic murmur. Good peripheral circulation. Respiratory: Normal respiratory effort.  No retractions. Lungs CTAB. Gastrointestinal: Soft and nontender. No distention. No abdominal bruits. No CVA tenderness. Genitourinary: normal external genitalia Musculoskeletal: No lower extremity tenderness. +2 BLE pitting edema and skin breakdown with serous weeping from breakdown.  No joint effusions. Neurologic:  Normal speech and language. No gross focal neurologic deficits are appreciated. No facial droop Skin:  Skin is warm, dry and intact. Pitting edema to mid thorax Psychiatric: Mood  and affect are normal. Speech and behavior are normal.  ____________________________________________   LABS (all labs ordered are listed, but only abnormal results are displayed)  Results for orders placed or performed during the hospital encounter of 2017/12/27 (from the past 24 hour(s))  Comprehensive metabolic panel      Status: Abnormal   Collection Time: 2017-12-27 11:24 AM  Result Value Ref Range   Sodium 143 135 - 145 mmol/L   Potassium 4.5 3.5 - 5.1 mmol/L   Chloride 110 98 - 111 mmol/L   CO2 21 (L) 22 - 32 mmol/L   Glucose, Bld 161 (H) 70 - 99 mg/dL   BUN 37 (H) 8 - 23 mg/dL   Creatinine, Ser 4.09 0.44 - 1.00 mg/dL   Calcium 9.5 8.9 - 81.1 mg/dL   Total Protein 6.5 6.5 - 8.1 g/dL   Albumin 3.7 3.5 - 5.0 g/dL   AST 51 (H) 15 - 41 U/L   ALT 22 0 - 44 U/L   Alkaline Phosphatase 77 38 - 126 U/L   Total Bilirubin 4.5 (H) 0.3 - 1.2 mg/dL   GFR calc non Af Amer 49 (L) >60 mL/min   GFR calc Af Amer 57 (L) >60 mL/min   Anion gap 12 5 - 15  CBC     Status: Abnormal   Collection Time: 12-27-2017 11:24 AM  Result Value Ref Range   WBC 11.5 (H) 3.6 - 11.0 K/uL   RBC 3.78 (L) 3.80 - 5.20 MIL/uL   Hemoglobin 12.2 12.0 - 16.0 g/dL   HCT 91.4 78.2 - 95.6 %   MCV 97.9 80.0 - 100.0 fL   MCH 32.2 26.0 - 34.0 pg   MCHC 32.9 32.0 - 36.0 g/dL   RDW 21.3 (H) 08.6 - 57.8 %   Platelets 152 150 - 440 K/uL  Troponin I     Status: Abnormal   Collection Time: Dec 27, 2017 11:24 AM  Result Value Ref Range   Troponin I 0.09 (HH) <0.03 ng/mL  Urinalysis, Complete w Microscopic     Status: Abnormal   Collection Time: 27-Dec-2017 11:24 AM  Result Value Ref Range   Color, Urine YELLOW (A) YELLOW   APPearance CLEAR (A) CLEAR   Specific Gravity, Urine 1.008 1.005 - 1.030   pH 5.0 5.0 - 8.0   Glucose, UA NEGATIVE NEGATIVE mg/dL   Hgb urine dipstick SMALL (A) NEGATIVE   Bilirubin Urine NEGATIVE NEGATIVE   Ketones, ur NEGATIVE NEGATIVE mg/dL   Protein, ur NEGATIVE NEGATIVE mg/dL   Nitrite NEGATIVE NEGATIVE   Leukocytes, UA NEGATIVE NEGATIVE   RBC / HPF 0-5 0 - 5 RBC/hpf   WBC, UA 0-5 0 - 5 WBC/hpf   Bacteria, UA MANY (A) NONE SEEN   Squamous Epithelial / LPF 0-5 0 - 5   Mucus PRESENT    Hyaline Casts, UA PRESENT   Brain natriuretic peptide     Status: Abnormal   Collection Time: 2017/12/27 11:24 AM  Result Value Ref Range     B Natriuretic Peptide >4,500.0 (H) 0.0 - 100.0 pg/mL   ____________________________________________  EKG My review and personal interpretation at Time: 10:45   Indication: ams  Rate: 125  Rhythm: sinus Axis: normal Other: no stemi nonspecific st abn, poor r wave progression ____________________________________________  RADIOLOGY  I personally reviewed all radiographic images ordered to evaluate for the above acute complaints and reviewed radiology reports and findings.  These findings were personally discussed with the patient.  Please see medical record for radiology report.  ____________________________________________   PROCEDURES  Procedure(s) performed:  Procedures    Critical Care performed: no ____________________________________________   INITIAL IMPRESSION / ASSESSMENT AND PLAN / ED COURSE  Pertinent labs & imaging results that were available during my care of the patient were reviewed by me and considered in my medical decision making (see chart for details).   DDX: Dehydration, sepsis, pna, uti, hypoglycemia, cva, drug effect, withdrawal, encephalitis   Carolyn Clarke is a 84 y.o. who presents to the ED with hesitation as described above.  She is afebrile mildly tachycardic but well-perfused.  Exam as above.  The patient will be placed on continuous pulse oximetry and telemetry for monitoring.  Laboratory evaluation will be sent to evaluate for the above complaints.   Presentation complicated by limited history provided by the patient.  Clinical Course as of Dec 17 1426  Mon Dec 16, 2017  1157 She is having intermittent runs of tachycardia that appear to be sinus and then she reverts down to low 80s and is in a sinus rhythm.  Almost appeared like A. fib but heart rate seems to be normal in the 120s.  Does have history of SVT.   [PR]  1336 Attempted to talk to nephew as he arrived but the nephew left with the patient's husband immediately upon arriving to the  ER.  States that he is returning.  Patient appears comfortable at this time.    [PR]  1407 Urinalysis does show evidence of many bacteria which would certainly expand the patient's encephalopathy.  Will give IV Rocephin.   [PR]    Clinical Course User Index [PR] Willy Eddyobinson, Brooke Steinhilber, MD     As part of my medical decision making, I reviewed the following data within the electronic MEDICAL RECORD NUMBER Nursing notes reviewed and incorporated, Labs reviewed, notes from prior ED visits.   ____________________________________________   FINAL CLINICAL IMPRESSION(S) / ED DIAGNOSES  Final diagnoses:  Elevated LFTs  Altered mental status, unspecified altered mental status type  Acute cystitis without hematuria  Congestive heart failure, unspecified HF chronicity, unspecified heart failure type (HCC)      NEW MEDICATIONS STARTED DURING THIS VISIT:  New Prescriptions   No medications on file     Note:  This document was prepared using Dragon voice recognition software and may include unintentional dictation errors.    Willy Eddyobinson, Norelle Runnion, MD 12/15/2017 [phone]

## 2017-12-16 NOTE — Progress Notes (Signed)
Palliative Medicine consult noted. Due to high referral volume, there may be a delay seeing this patient. Please call the Palliative Medicine Team office at [phone] if recommendations are needed in the interim.  Thank you for inviting us to see this patient.  Jocilyn Trego G. Demari Kropp, RN, BSN, CHPN Palliative Medicine Team 12/15/2017 3:30 PM Office [phone] 

## 2017-12-16 NOTE — ED Triage Notes (Signed)
Pt arrived via EMS from home with husband. Per EMS they were called out for altered mental status and lower extremity edema that is weeping fluid. Pt lives with husband who EMS states is also a poor historian.  Pt is alert to self only.  Pt arrived saturated in urine states she has not been able hold urine for 3 days.  Pt has edema from lower extremities up to waist line.   Per nephew on scene pt has hx of cancer and thinks it is colon cancer but is not sure.

## 2017-12-16 NOTE — ED Notes (Signed)
Pt has mass to left breast

## 2017-12-16 NOTE — H&P (Signed)
[facility] - [place] [facility]   PATIENT NAME: Carolyn Clarke    MR#:  [id number]  DATE OF BIRTH:  06-04-1926  DATE OF ADMISSION:  08/29/17  PRIMARY CARE PHYSICIAN: Jaclyn Shaggyate, Denny C, MD   REQUESTING/REFERRING PHYSICIAN: Dr. Roxan Hockeyobinson  CHIEF COMPLAINT:   Chief Complaint  Patient presents with  . Altered Mental Status  . Leg Swelling    HISTORY OF PRESENT ILLNESS:  Carolyn Clarke  is a 84 y.o. female with a known history of systolic chf, severe aortic stenosis presents to the emergency room brought in by family due to worsening lower extremity edema, confusion and incontinence to urine.  Patient seems to have gotten progressively weaker over the past few weeks.  Here she has been found to have acute on chronic systolic CHF and is being admitted to the hospitalist service.  Urinalysis showed many bacteria but no WBCs or nitrites.  PAST MEDICAL HISTORY:   Past Medical History:  Diagnosis Date  . Anemia   . Aortic valve stenosis   . CHF (congestive heart failure) (HCC)   . Hypokalemia   . Kidney stone     PAST SURGICAL HISTORY:  History reviewed. No pertinent surgical history.  SOCIAL HISTORY:   Social History   Tobacco Use  . Smoking status: Never Smoker  . Smokeless tobacco: Never Used  Substance Use Topics  . Alcohol use: Never    Frequency: Never    FAMILY HISTORY:   Family History  Problem Relation Age of Onset  . Heart failure Mother   . Hypertension Father     DRUG ALLERGIES:  No Known Allergies  REVIEW OF SYSTEMS:   Review of Systems  Unable to perform ROS: Mental status change    MEDICATIONS AT HOME:   Prior to Admission medications   Medication Sig Start Date End Date Taking? Authorizing Provider  furosemide (LASIX) 40 MG tablet Take 1 tablet (40 mg total) by mouth daily. 09/05/17  Yes Antonieta IbaGollan, Timothy J, MD  Multiple Vitamin (MULTIVITAMIN) tablet Take 1 tablet by mouth daily.   Yes [provider]  potassium  chloride SA (K-DUR,KLOR-CON) 20 MEQ tablet Take 1 tablet (20 mEq total) by mouth daily. 09/05/17  Yes Gollan, Tollie Pizzaimothy J, MD     VITAL SIGNS:  Blood pressure 118/85, pulse (!) 126, temperature 97.7 F (36.5 C), temperature source Oral, resp. rate (!) 24, height 5\' 3"  (1.6 m), weight 71.5 kg, SpO2 95 %.  PHYSICAL EXAMINATION:  Physical Exam  GENERAL:  84 y.o.-year-old patient lying in the bed with no acute distress.  EYES: Pupils equal, round, reactive to light and accommodation. No scleral icterus. Extraocular muscles intact.  HEENT: Head atraumatic, normocephalic. Oropharynx and nasopharynx clear. No oropharyngeal erythema, moist oral mucosa. NECK:  Supple, no jugular venous distention. No thyroid enlargement, no tenderness.  LUNGS: Increased work of breathing with bibasilar crackles CARDIOVASCULAR: S1, S2 normal. No murmurs, rubs, or gallops.  ABDOMEN: Soft, nontender, nondistended. Bowel sounds present. No organomegaly or mass.  EXTREMITIES: LE edema. NEUROLOGIC: Cranial nerves II through XII are intact. Decreased motor strength all over PSYCHIATRIC: The patient is pleasantly confused  LABORATORY PANEL:   CBC Recent Labs  Lab 29-Sep-2017 1124  WBC 11.5*  HGB 12.2  HCT 37.0  PLT 152   ------------------------------------------------------------------------------------------------------------------  Chemistries  Recent Labs  Lab 29-Sep-2017 1124  NA 143  K 4.5  CL 110  CO2 21*  GLUCOSE 161*  BUN 37*  CREATININE 0.99  CALCIUM 9.5  AST 51*  ALT 22  ALKPHOS 77  BILITOT 4.5*   ------------------------------------------------------------------------------------------------------------------  Cardiac Enzymes Recent Labs  Lab Mar 03, 2018 1124  TROPONINI 0.09*   ------------------------------------------------------------------------------------------------------------------  RADIOLOGY:  Dg Chest Portable 1 View  Result Date: 2017-08-18 CLINICAL DATA:  Holter mental  status with extremity swelling. Assess for edema. EXAM: PORTABLE CHEST 1 VIEW COMPARISON:  August 22, 2017 FINDINGS: The heart size and mediastinal contours are stable. The heart size is probably enlarged. There is consolidation of bilateral lung bases with bilateral pleural effusion unchanged compared prior exam. There is mild increased pulmonary interstitium bilaterally. The visualized skeletal structures are stable. IMPRESSION: Mild interstitial edema. Consolidation of bilateral lung bases with bilateral pleural effusions unchanged compared to prior chest x-ray of August 22, 2017. Electronically Signed   By: Sherian ReinWei-Chen  Lin M.D.   On: 02019-04-14 11:14   [place] Abdomen Limited Ruq  Result Date: 2017-08-18 CLINICAL DATA:  Elevated liver function tests EXAM: ULTRASOUND ABDOMEN LIMITED RIGHT UPPER QUADRANT COMPARISON:  None. FINDINGS: Gallbladder: 2.3 cm mobile masslike area within the lumen of the gallbladder with no color Doppler flow, consistent with tumefactive sludge. No shadowing gallstones. No wall thickening or focal tenderness Common bile duct: Diameter: 3 mm Liver: No focal lesion identified. Within normal limits in parenchymal echogenicity. Portal vein is patent on color Doppler imaging with normal direction of blood flow towards the liver. Right pleural effusion, known from prior chest x-ray. IMPRESSION: 1. Tumefactive sludge.  No gallstone or cholecystitis. 2. Right pleural effusion. Electronically Signed   By: Marnee SpringJonathon  Watts M.D.   On: 02019-04-14 13:52     IMPRESSION AND PLAN:   *Acute on chronic systolic congestive heart failure.  Start IV Lasix 40 mg twice daily.  Monitor input and output.  Replace potassium.  Repeat BMP in the morning.  *Atrial tachycardia with intermittent heart rate being in the 130s.  Will start low-dose metoprolol 25 mg twice daily with first dose now.  *Severe aortic stenosis.  Prior conversations between cardiology and family have been noted.  Medical management opted  instead of aggressive procedures.  *Acute encephalopathy.  Etiology unclear.  Patient is improved since arrival in the emergency room.  UTI considered in the emergency room but no WBCs or nitrites in urine.  No antibiotics.  *Jaundice with bilirubin of 4.5.  Right upper quadrant ultrasound showed nothing acute other than sludge.  No tenderness on examination. Could be due to CHF.  Will repeat labs in the morning.  DVT prophylaxis with Lovenox  We will consult palliative care for goals of care.  Would be appropriate for discharge home with hospice.  Poor prognosis.  Unable to contact patient's nephew who was here earlier.  All the records are reviewed and case discussed with ED provider. Management plans discussed with the patient, family and they are in agreement.  CODE STATUS: DNR  TOTAL TIME TAKING CARE OF THIS PATIENT: 40 minutes.   Orie FishermanSrikar R Saraiya Kozma M.D on 2017-08-18 at 3:21 PM  Between 7am to 6pm - Pager - [phone]  After 6pm go to [email or web address] - password [email or web address] [facility]  [facility] [place] [facility]  Office  [phone]  CC: Primary care physician; Jaclyn Shaggyate, Denny C, MD  Note: This dictation was prepared with Dragon dictation along with smaller phrase technology. Any transcriptional errors that result from this process are unintentional.

## 2017-12-16 NOTE — Progress Notes (Signed)
Patient admitted to unit. Oriented to room, call bell, and staff. Telemetry box verification with tele clerk- Box#: 28. Bed in lowest position. Fall safety plan reviewed.Pt confusedx3, trying to get out of bed. Low bed ordered. Full assessment to Epic. Skin assessment verified with Greer EeSusan Presto, RN. Upon assessment, no pedal pulses felt, no gangrenous, pt able to wiggle toes, does have weak femoral pulse, Dr. Elpidio AnisSudini notified no new orders received  Will continue to monitor.

## 2017-12-16 NOTE — ED Notes (Signed)
Pt taken off bed pan and cleaned up by this tech and Judeth CornfieldStephanie, Rn.

## 2017-12-16 NOTE — Progress Notes (Addendum)
CCMD reported a 6 beat run of SVT. PO Metoprolol held due to BP of 97/67. MD Anne HahnWillis made aware. No new orders. Pt HR at 2130 was 113. Pt current HR is 78. Will continue to monitor.

## 2017-12-17 LAB — CBC
HEMATOCRIT: 32.3 % — AB (ref 35.0–47.0)
HEMOGLOBIN: 10.4 g/dL — AB (ref 12.0–16.0)
MCH: 31.4 pg (ref 26.0–34.0)
MCHC: 32.3 g/dL (ref 32.0–36.0)
MCV: 97.3 fL (ref 80.0–100.0)
Platelets: 167 10*3/uL (ref 150–440)
RBC: 3.32 MIL/uL — AB (ref 3.80–5.20)
RDW: 18.4 % — ABNORMAL HIGH (ref 11.5–14.5)
WBC: 14.4 10*3/uL — ABNORMAL HIGH (ref 3.6–11.0)

## 2017-12-17 LAB — BASIC METABOLIC PANEL
ANION GAP: 9 (ref 5–15)
BUN: 40 mg/dL — ABNORMAL HIGH (ref 8–23)
CO2: 26 mmol/L (ref 22–32)
Calcium: 9.5 mg/dL (ref 8.9–10.3)
Chloride: 110 mmol/L (ref 98–111)
Creatinine, Ser: 1.3 mg/dL — ABNORMAL HIGH (ref 0.44–1.00)
GFR calc non Af Amer: 35 mL/min — ABNORMAL LOW (ref >60)
GFR, EST AFRICAN AMERICAN: 41 mL/min — AB (ref >60)
Glucose, Bld: 143 mg/dL — ABNORMAL HIGH (ref 70–99)
POTASSIUM: 4.5 mmol/L (ref 3.5–5.1)
Sodium: 145 mmol/L (ref 135–145)

## 2017-12-17 MED ORDER — SODIUM CHLORIDE 0.9% FLUSH
3.0000 mL | Freq: Two times a day (BID) | INTRAVENOUS | Status: DC
  Administered 2017-12-17 – 2017-12-18 (×3): 3 mL via INTRAVENOUS

## 2017-12-17 MED ORDER — OXYCODONE-ACETAMINOPHEN 5-325 MG PO TABS
1.0000 | ORAL_TABLET | Freq: Four times a day (QID) | ORAL | Status: DC | PRN
  Administered 2017-12-17: 1 via ORAL
  Filled 2017-12-17: qty 1

## 2017-12-17 MED ORDER — MORPHINE SULFATE (PF) 2 MG/ML IV SOLN
2.0000 mg | INTRAVENOUS | Status: DC | PRN
  Administered 2017-12-18: 2 mg via INTRAVENOUS
  Filled 2017-12-17: qty 1

## 2017-12-17 MED ORDER — SODIUM CHLORIDE 0.9 % IV BOLUS
500.0000 mL | Freq: Once | INTRAVENOUS | Status: AC
  Administered 2017-12-17: 500 mL via INTRAVENOUS

## 2017-12-17 MED ORDER — ENOXAPARIN SODIUM 30 MG/0.3ML ~~LOC~~ SOLN
30.0000 mg | SUBCUTANEOUS | Status: DC
  Administered 2017-12-18: 30 mg via SUBCUTANEOUS
  Filled 2017-12-17: qty 0.3

## 2017-12-17 NOTE — Progress Notes (Signed)
Advanced care plan.  Purpose of the Encounter: goal of care Parties in Attendance: patient , grandson  Patient's Decision Capacity: Not intact   Subjective/Patient's story: Pt is [age] with severe aortic stenosis with chose not to have aortic surgery now with CHF altered mental status   Objective/Medical story gust with patient's husband and grandson in detail regarding overall poor prognosis.  We discussed the fact that patient may be actively dying.  They would like to see how she does over the next 24 to 48 hours and will make her comfort care    Goals of care determination: Possible comfort care in the near future    CODE STATUS: DNR   Time spent discussing advanced care planning: 16 minutes

## 2017-12-17 NOTE — Progress Notes (Signed)
No charge note.  Palliative care consult received. Chart reviewed. Discussed patient with Dr. Allena KatzPatel. He has just had extensive discussion with patient and family about goals of care. Per discussion, will hold off on GOC meeting with family for now. Please call PMT if we can be of assistance.   Thank you for this consult.  Gerlean RenShae Lee Emmajane Altamura, DNP, AGNP-C Palliative Medicine Team Team Phone # [phone]  Pager # [phone]

## 2017-12-17 NOTE — Progress Notes (Signed)
BP is 82/64.  We had a lot of difficulty getting a pulse ox, likely due to low BP and cold extremeties.  Pulse ox on 2 L Burns is 99%.  Dr. Allena KatzPatel is aware of low BP.  He advised family to have significant friends and family members to come in.

## 2017-12-17 NOTE — Evaluation (Signed)
Physical Therapy Evaluation Patient Details Name: Carolyn Clarke Trunnell MRN: [id number] DOB: 1927/01/01 Today's Date: 12/17/2017   History of Present Illness  Carolyn Clarke Hinds is a 84 y.o. female history of anemia, aortic valve stenosis, CHF presents to the ER from home with confusion, worsening swelling up to her chest as well as urinary incontinence x3 days.Patient seems to have gotten progressively weaker over the past few weeks.  Here she has been found to have acute on chronic systolic CHF and is being admitted to the hospitalist service.   Clinical Impression  Due to AMS pt is limited in ability to participate in PT. Pt A+O x 1 (self), and is not able to carry on conversation with PT. Pt verbalizes "yes" regardless of question or prompt. Pt was able to lift hands over head, but only after therapist asked several times and demonstrated activity.  Pt was unable to move BLE w/o physical assistance and cuing from therapist. Pt was max assist supine to sit, providing only minimal assistance with BUE on bed after therapist initiated activity. Pt was able to maintain balance at EOB with BUE on bed and feet on floor. Pt HR increased from resting value of 78 to 105 with sitting activity. Attempted max assist sit to stand but pt was unable to participate in activity due to weakness and AMS. Pt HR increased to 120 with sit to stand attempt, and actvitiy was terminated and pt was transitioned back to supine in bed. Pt HR did reduce back to 82 after 2-3 minutes supine in bed. Due to AMS pt is unable to provide any information on prior lvl of function of home accessibility, however previous PT eval from 3 mo ago provides some info (see below for details). Will plan to follow up and clarify with family when able. Would benefit from skilled PT to address above deficits and promote movement/mobility. Recommend transition to SNF upon discharge from acute hospitalization.    Follow Up Recommendations SNF    Equipment  Recommendations  Rolling walker with 5" wheels    Recommendations for Other Services       Precautions / Restrictions Precautions Precautions: Fall Restrictions Weight Bearing Restrictions: No      Mobility  Bed Mobility Overal bed mobility: Needs Assistance Bed Mobility: Supine to Sit     Supine to sit: Max assist     General bed mobility comments: Pt provides some effort from BUE once movement is initiated, however needs full support at trunk and legs for transition to sitting.   Transfers Overall transfer level: Needs assistance   Transfers: Sit to/from Stand Sit to Stand: Total assist         General transfer comment: Pt was unable to assist in standing, immediately retunred to sitting after initial toal assist lift off of EOB. Did not seem to cognitively understand task she was being asked to perfrom. HR increased to 121 with standing attempt.  Ambulation/Gait             General Gait Details: Pt unable to particiapte in gait training.   Stairs            Wheelchair Mobility    Modified Rankin (Stroke Patients Only)       Balance Overall balance assessment: Needs assistance Sitting-balance support: Bilateral upper extremity supported;Feet supported Sitting balance-Leahy Scale: Fair Sitting balance - Comments: Pt was able to sit on side of bed fro 2 minutes without therapist assist. Excessively kyphotic spine, unable to maintian posiiton to perterbations.  Standing balance-Leahy Scale: Zero                               Pertinent Vitals/Pain Pain Assessment: No/denies pain    Home Living Family/patient expects to be discharged to:: Hospice/Palliative care Living Arrangements: Spouse/significant other             Home Equipment: Cane - single point(Pt unable to proivde info-- info gathered from previous PT eval 664mo ago)      Prior Function Level of Independence: Independent with assistive device(s)(Pt unable to  proivde info-- info gathered from previous PT eval 664mo ago)         Comments: was previously using SPC, reports several falls, however no fractures(Pt unable to proivde info-- info gathered from previous PT eval 664mo ago)     Hand Dominance        Extremity/Trunk Assessment   Upper Extremity Assessment Upper Extremity Assessment: Generalized weakness    Lower Extremity Assessment Lower Extremity Assessment: Generalized weakness    Cervical / Trunk Assessment Cervical / Trunk Assessment: Kyphotic  Communication   Communication: Receptive difficulties;Expressive difficulties(Due to AMS)  Cognition Arousal/Alertness: Lethargic Behavior During Therapy: Flat affect Overall Cognitive Status: Impaired/Different from baseline Area of Impairment: Orientation;Attention;Following commands                 Orientation Level: Person Current Attention Level: Alternating   Following Commands: Follows one step commands inconsistently       General Comments: Pt verbalizes yes regardless of question. Pt is able to follow some commands if given physical cue      General Comments      Exercises Other Exercises Other Exercises: BUE AAROM: Shoudler flex/ext, elbow flex/ext; BLE AAROM: Hip flex, Knee flex/ext, ankle PF/DF Other Exercises: bed mobiltiy: supine to sit max assist; transfers: attempted sit to stand.    Assessment/Plan    PT Assessment Patient needs continued PT services  PT Problem List Decreased strength;Decreased mobility;Decreased safety awareness;Decreased coordination;Decreased cognition;Decreased knowledge of use of DME;Decreased balance;Decreased activity tolerance;Decreased range of motion       PT Treatment Interventions DME instruction;Gait training;Stair training;Balance training;Therapeutic exercise;Therapeutic activities;Patient/family education;Neuromuscular re-education;Functional mobility training    PT Goals (Current goals can be found in the  Care Plan section)  Acute Rehab PT Goals PT Goal Formulation: Patient unable to participate in goal setting    Frequency     Barriers to discharge        Co-evaluation               AM-PAC PT "6 Clicks" Daily Activity  Outcome Measure Difficulty turning over in bed (including adjusting bedclothes, sheets and blankets)?: Unable Difficulty moving from lying on back to sitting on the side of the bed? : Unable Difficulty sitting down on and standing up from a chair with arms (e.g., wheelchair, bedside commode, etc,.)?: Unable Help needed moving to and from a bed to chair (including a wheelchair)?: Total Help needed walking in hospital room?: Total Help needed climbing 3-5 steps with a railing? : Total 6 Click Score: 6    End of Session Equipment Utilized During Treatment: Gait belt Activity Tolerance: Patient limited by fatigue;Patient limited by lethargy;Treatment limited secondary to medical complications (Comment)(AMS) Patient left: in bed;with bed alarm set;with call bell/phone within reach   PT Visit Diagnosis: Unsteadiness on feet (R26.81);Muscle weakness (generalized) (M62.81)    Time: [id number] PT Time Calculation (min) (ACUTE ONLY): 20 min  Charges:   PT Evaluation $PT Eval Moderate Complexity: 1 Mod          [employer], SPT 12/17/17,11:43 AM

## 2017-12-17 NOTE — Progress Notes (Signed)
Anticoagulation monitoring(Lovenox):  84yo  female ordered Lovenox 40 mg Q24h for DVT prevention.  Filed Weights   12/30/2017 1038 12/25/2017 1655 12/17/17 0344  Weight: 157 lb 9.6 oz (71.5 kg) 150 lb 3.2 oz (68.1 kg) 151 lb (68.5 kg)   BMI    Lab Results  Component Value Date   CREATININE 1.30 (H) 12/17/2017   CREATININE 0.99 12/09/2017   CREATININE 0.87 08/24/2017   Estimated Creatinine Clearance: 27.2 mL/min (A) (by C-G formula based on SCr of 1.3 mg/dL (H)). Hemoglobin & Hematocrit     Component Value Date/Time   HGB 10.4 (L) 12/17/2017 0527   HCT 32.3 (L) 12/17/2017 [id number]     Per Protocol for Patient with estCrcl < 30 ml/min and BMI < 40, will transition to Lovenox 30 mg Q24h.     Clovia CuffLisa Birdia Jaycox, PharmD, BCPS 12/17/2017 12:03 PM

## 2017-12-17 NOTE — Progress Notes (Signed)
Patient is moaning and upset about something, although it's not clear what.  She is almost in tears.  Spoke with Dr. Elisabeth PigeonVachhani and received orders for Percocet.

## 2017-12-17 NOTE — Progress Notes (Signed)
[place] at [facility]                                                                                                                                                                                  Patient Demographics   Carolyn Clarke, is a 84 y.o. female, DOB - [birth year], OFB:[id number]  Admit date - 12/23/2017   Admitting Physician Hillary Bow, MD  Outpatient Primary MD for the patient is Albina Billet, MD   LOS - 1  Subjective: Patient admitted with acute CHF and encephalopathy she is drowsy.  Unable to provide any review of systems I met with her husband and her grandchildren    Review of Systems:   CONSTITUTIONAL: Able to provide due to patient being drowsy Vitals:   Vitals:   12/08/2017 1929 12/17/17 0344 12/17/17 0717 12/17/17 1030  BP: 97/67   99/71  Pulse: 73   66  Resp: 18  (!) 24   Temp: (!) 97.5 F (36.4 C)     TempSrc: Oral     SpO2: (!) 88%  99% 92%  Weight:  68.5 kg    Height:        Wt Readings from Last 3 Encounters:  12/17/17 68.5 kg  10/28/17 68.5 kg  09/26/17 65 kg     Intake/Output Summary (Last 24 hours) at 12/17/2017 1355 Last data filed at 01/04/2018 1700 Gross per 24 hour  Intake 100 ml  Output 500 ml  Net -400 ml    Physical Exam:   GENERAL: Likely ill-appearing HEAD, EYES, EARS, NOSE AND THROAT: Atraumatic, normocephalic. Pupils equal and reactive to light. Sclerae anicteric. No conjunctival injection. No oro-pharyngeal erythema.  NECK: Supple. There is no jugular venous distention. No bruits, no lymphadenopathy, no thyromegaly.  HEART: Regular rate and rhythm,.  Positive systolic murmurs, no rubs, no clicks.  LUNGS: Clear to auscultation bilaterally. No rales or rhonchi. No wheezes.  ABDOMEN: Soft, flat, nontender, nondistended. Has good bowel sounds. No hepatosplenomegaly appreciated.  EXTREMITIES: No evidence of any cyanosis, clubbing, or peripheral edema.  +2 pedal and radial pulses bilaterally.   NEUROLOGIC: Drowsy SKIN: Moist and warm with no rashes appreciated.  Psych: Drowsy  LN: No inguinal LN enlargement    Antibiotics   Anti-infectives (From admission, onward)   Start     Dose/Rate Route Frequency Ordered Stop   12/10/2017 1400  cefTRIAXone (ROCEPHIN) 1 g in sodium chloride 0.9 % 100 mL IVPB     1 g 200 mL/hr over 30 Minutes Intravenous  Once 12/27/2017 1348 12/15/2017 1444      Medications   Scheduled Meds: . enoxaparin (LOVENOX) injection  30 mg Subcutaneous Q24H  .  furosemide  40 mg Intravenous BID  . mouth rinse  15 mL Mouth Rinse BID  . metoprolol tartrate  25 mg Oral BID  . potassium chloride SA  20 mEq Oral Daily  . sodium chloride flush  3 mL Intravenous Q12H  . sodium chloride flush  3 mL Intravenous Q12H   Continuous Infusions: PRN Meds:.acetaminophen **OR** acetaminophen, albuterol, ondansetron **OR** ondansetron (ZOFRAN) IV, polyethylene glycol   Data Review:   Micro Results No results found for this or any previous visit (from the past 240 hour(s)).  Radiology Reports Dg Chest Portable 1 View  Result Date: 12/10/2017 CLINICAL DATA:  Holter mental status with extremity swelling. Assess for edema. EXAM: PORTABLE CHEST 1 VIEW COMPARISON:  August 22, 2017 FINDINGS: The heart size and mediastinal contours are stable. The heart size is probably enlarged. There is consolidation of bilateral lung bases with bilateral pleural effusion unchanged compared prior exam. There is mild increased pulmonary interstitium bilaterally. The visualized skeletal structures are stable. IMPRESSION: Mild interstitial edema. Consolidation of bilateral lung bases with bilateral pleural effusions unchanged compared to prior chest x-ray of August 22, 2017. Electronically Signed   By: Abelardo Diesel M.D.   On: 12/21/2017 11:14   US Abdomen Limited Ruq  Result Date: 12/05/2017 CLINICAL DATA:  Elevated liver function tests EXAM: ULTRASOUND ABDOMEN LIMITED RIGHT UPPER QUADRANT COMPARISON:   None. FINDINGS: Gallbladder: 2.3 cm mobile masslike area within the lumen of the gallbladder with no color Doppler flow, consistent with tumefactive sludge. No shadowing gallstones. No wall thickening or focal tenderness Common bile duct: Diameter: 3 mm Liver: No focal lesion identified. Within normal limits in parenchymal echogenicity. Portal vein is patent on color Doppler imaging with normal direction of blood flow towards the liver. Right pleural effusion, known from prior chest x-ray. IMPRESSION: 1. Tumefactive sludge.  No gallstone or cholecystitis. 2. Right pleural effusion. Electronically Signed   By: Monte Fantasia M.D.   On: 12/25/2017 13:52     CBC Recent Labs  Lab 01/03/2018 1124 12/17/17 0527  WBC 11.5* 14.4*  HGB 12.2 10.4*  HCT 37.0 32.3*  PLT 152 167  MCV 97.9 97.3  MCH 32.2 31.4  MCHC 32.9 32.3  RDW 19.1* 18.4*    Chemistries  Recent Labs  Lab 12/14/2017 1124 12/17/17 0527  NA 143 145  K 4.5 4.5  CL 110 110  CO2 21* 26  GLUCOSE 161* 143*  BUN 37* 40*  CREATININE 0.99 1.30*  CALCIUM 9.5 9.5  AST 51*  --   ALT 22  --   ALKPHOS 77  --   BILITOT 4.5*  --    ------------------------------------------------------------------------------------------------------------------ estimated creatinine clearance is 27.2 mL/min (A) (by C-G formula based on SCr of 1.3 mg/dL (H)). ------------------------------------------------------------------------------------------------------------------ No results for input(s): HGBA1C in the last 72 hours. ------------------------------------------------------------------------------------------------------------------ No results for input(s): CHOL, HDL, LDLCALC, TRIG, CHOLHDL, LDLDIRECT in the last 72 hours. ------------------------------------------------------------------------------------------------------------------ No results for input(s): TSH, T4TOTAL, T3FREE, THYROIDAB in the last 72 hours.  Invalid input(s):  FREET3 ------------------------------------------------------------------------------------------------------------------ No results for input(s): VITAMINB12, FOLATE, FERRITIN, TIBC, IRON, RETICCTPCT in the last 72 hours.  Coagulation profile No results for input(s): INR, PROTIME in the last 168 hours.  No results for input(s): DDIMER in the last 72 hours.  Cardiac Enzymes Recent Labs  Lab 12/06/2017 1124  TROPONINI 0.09*   ------------------------------------------------------------------------------------------------------------------ Invalid input(s): POCBNP    Assessment & Plan   *Acute on chronic systolic congestive heart failure elated to severe aortic stenosis.    Tenia IV Lasix  40 mg twice daily.    *Atrial tachycardia with intermittent heart rate being in the 130s.    Continue metoprolol  *Severe aortic stenosis.  Prior conversations between cardiology and family have been noted.  Medical management opted instead of aggressive procedures.  Stenosis very poor discussed with the family patient will likely need hospice if no improvement during this hospitalization  *Acute encephalopathy.    Suspect due to progressive valvular disease.  *Jaundice with bilirubin of 4.5.  Right upper quadrant ultrasound showed nothing acute other than sludge.  Likely due to passive congestion from her CHF       Code Status Orders  (From admission, onward)         Start     Ordered   12/17/17 1115  Do not attempt resuscitation (DNR)  Continuous    Question Answer Comment  In the event of cardiac or respiratory ARREST Do not call a "code blue"   In the event of cardiac or respiratory ARREST Do not perform Intubation, CPR, defibrillation or ACLS   In the event of cardiac or respiratory ARREST Use medication by any route, position, wound care, and other measures to relive pain and suffering. May use oxygen, suction and manual treatment of airway obstruction as needed for comfort.    Comments RN may pronounce      12/17/17 1114        Code Status History    Date Active Date Inactive Code Status Order ID Comments User Context   12/29/2017 1517 12/17/2017 1114 DNR [id number]  Hillary Bow, MD ED   08/22/2017 1528 08/26/2017 1735 DNR [id number]  Gladstone Lighter, MD Inpatient           Consults cardiology  DVT Prophylaxis  Lovenox   Lab Results  Component Value Date   PLT 167 12/17/2017     Time Spent in minutes   35 minutes  Greater than 50% of time spent in care coordination and counseling patient regarding the condition and plan of care.   Dustin Flock M.D on 12/17/2017 at 1:55 PM  Between 7am to 6pm - Pager - [phone]  After 6pm go to [email or web address] - Proofreader  [employer]   Office  [phone]

## 2017-12-18 MED ORDER — LORAZEPAM 2 MG/ML IJ SOLN
1.0000 mg | INTRAMUSCULAR | Status: DC | PRN
  Administered 2017-12-18 (×2): 1 mg via INTRAVENOUS
  Filled 2017-12-18 (×2): qty 1

## 2017-12-18 MED ORDER — MORPHINE SULFATE (CONCENTRATE) 10 MG/0.5ML PO SOLN: 5.0000 mg | ORAL | Status: DC | PRN

## 2017-12-18 MED ORDER — LORAZEPAM 1 MG PO TABS: 1.0000 mg | ORAL_TABLET | ORAL | Status: DC | PRN

## 2017-12-18 MED ORDER — ACETAMINOPHEN 650 MG RE SUPP: 650.0000 mg | Freq: Four times a day (QID) | RECTAL | Status: DC | PRN

## 2017-12-18 MED ORDER — LORAZEPAM 2 MG/ML PO CONC
1.0000 mg | ORAL | Status: DC | PRN
  Filled 2017-12-18: qty 0.5

## 2017-12-18 MED ORDER — MORPHINE 100MG IN NS 100ML (1MG/ML) PREMIX INFUSION
4.0000 mg/h | INTRAVENOUS | Status: DC
  Administered 2017-12-18: 4 mg/h via INTRAVENOUS
  Filled 2017-12-18 (×3): qty 100

## 2017-12-18 MED ORDER — ACETAMINOPHEN 325 MG PO TABS: 650.0000 mg | ORAL_TABLET | Freq: Four times a day (QID) | ORAL | Status: DC | PRN

## 2017-12-24 ENCOUNTER — Ambulatory Visit: Payer: Medicare Other | Admitting: Family

## 2018-01-05 NOTE — Care Management (Signed)
Family has decide not to pursue surgery for Aortic Valve replacement.  There was a palliative consult for goals of care but attending met with family prior to discuss goals of care. Attending spoke with family 8/13 and it was decided at that time to monitor over 24 - 48  hours for response to medical management of heart failure and symptoms before deciding to pursue comfort care. She is DNR.  She received a fluid bolus during the night for bp of 98/62

## 2018-01-05 NOTE — Progress Notes (Signed)
PT Cancellation Note  Patient Details Name: Carolyn Clarke MRN: [id number] DOB: 1926-08-18   Cancelled Treatment:    Reason Eval/Treat Not Completed: Medical issues which prohibited therapy(Per chart review, patient/family considering transition to comfort care if not improved over next 24 hours.  Will hold PT services at this time pending full determination of goals of care.,)  Tommy RainwaterKristen H. Manson PasseyBrown, PT, DPT, NCS 2017-11-18, 10:55 AM [phone]

## 2018-01-05 NOTE — Progress Notes (Signed)
Advanced care plan.  Purpose of the Encounter: goal of care Parties in Attendance: patient , grandson  Patient's Decision Capacity: Not intact   Subjective/Patient's story: Pt is [age] with severe aortic stenosis she chose not to have aortic surgery now with CHF and altered mental status patient has not shown any improvement.  She is in a lot of pain and uncomfortable today   Objective/Medical story gust with patient's husband and grandson  Following up on the conversation from yesterday.  Patient has not shown any improvement.  I recommended she be comfort care completely.    Goals of care determination: Initiate comfort care    CODE STATUS: DNR   Time spent discussing advanced care planning: 16 minutes

## 2018-01-05 NOTE — Progress Notes (Signed)
Pt moans continuously as if in pain. Pt was asked earlier if she was in pain and she answered yes. PRN Norco given with some relief but pt resumed moaning within a couple of hours. MD Anne HahnWillis made aware. 500 ml bolus given due to blood pressure being 98/62. BP increased to 102/65 and IV Morphine given. Pt still continues to call out but has had some periods of rest throughout the night.

## 2018-01-05 NOTE — Progress Notes (Signed)
[employer] - [place] at [facility]                                                                                                                                                                                  Patient Demographics   Carolyn Clarke, is a 84 y.o. female, DOB - 11/01/1926, NWG:[id number]  Admit date - 10-09-17   Admitting Physician Milagros LollSrikar Sudini, MD  Outpatient Primary MD for the patient is Jaclyn Shaggyate, Denny C, MD   LOS - 2  Subjective: Patient very uncomfortable today having pain and screaming.  Review of Systems:   CONSTITUTIONAL: Able to provide due to patient being drowsy Vitals:   Vitals:   12/26/2017 0510 12/22/2017 0955 12/20/2017 1044 12/23/2017 1400  BP: 99/60 91/69  (!) 84/65  Pulse: (!) 121 (!) 117  (!) 117  Resp: 18 18  16   Temp: 97.8 F (36.6 C) 98.3 F (36.8 C)  (!) 97.5 F (36.4 C)  TempSrc:  Oral  Oral  SpO2: 94% 100% 100% 100%  Weight: 68.9 kg   69.9 kg  Height:    5\' 7"  (1.702 m)    Wt Readings from Last 3 Encounters:  12/09/2017 69.9 kg  10/28/17 68.5 kg  09/26/17 65 kg     Intake/Output Summary (Last 24 hours) at 12/29/2017 1422 Last data filed at 12/29/2017 [id number] Gross per 24 hour  Intake 500 ml  Output 0 ml  Net 500 ml    Physical Exam:   GENERAL: Likely ill-appearing HEAD, EYES, EARS, NOSE AND THROAT: Atraumatic, normocephalic. Pupils equal and reactive to light. Sclerae anicteric. No conjunctival injection. No oro-pharyngeal erythema.  NECK: Supple. There is no jugular venous distention. No bruits, no lymphadenopathy, no thyromegaly.  HEART: Regular rate and rhythm,.  Positive systolic murmurs, no rubs, no clicks.  LUNGS: Clear to auscultation bilaterally. No rales or rhonchi. No wheezes.  ABDOMEN: Soft, flat, nontender, nondistended. Has good bowel sounds. No hepatosplenomegaly appreciated.  EXTREMITIES: No evidence of any cyanosis, clubbing, or peripheral edema.  +2 pedal and radial pulses bilaterally.  NEUROLOGIC:  Agitated and screaming  sKIN: Moist and warm with no rashes appreciated.  Psych: Agitated and screaming LN: No inguinal LN enlargement    Antibiotics   Anti-infectives (From admission, onward)   Start     Dose/Rate Route Frequency Ordered Stop   11-24-2017 1400  cefTRIAXone (ROCEPHIN) 1 g in sodium chloride 0.9 % 100 mL IVPB     1 g 200 mL/hr over 30 Minutes Intravenous  Once 11-24-2017 1348 11-24-2017 1444      Medications   Scheduled Meds: . mouth rinse  15 mL Mouth Rinse BID   Continuous Infusions: .  morphine     PRN Meds:.acetaminophen **OR** acetaminophen, LORazepam **OR** LORazepam **OR** LORazepam, morphine CONCENTRATE **OR** morphine CONCENTRATE   Data Review:   Micro Results No results found for this or any previous visit (from the past 240 hour(s)).  Radiology Reports Dg Chest Portable 1 View  Result Date: January 06, 2018 CLINICAL DATA:  Holter mental status with extremity swelling. Assess for edema. EXAM: PORTABLE CHEST 1 VIEW COMPARISON:  August 22, 2017 FINDINGS: The heart size and mediastinal contours are stable. The heart size is probably enlarged. There is consolidation of bilateral lung bases with bilateral pleural effusion unchanged compared prior exam. There is mild increased pulmonary interstitium bilaterally. The visualized skeletal structures are stable. IMPRESSION: Mild interstitial edema. Consolidation of bilateral lung bases with bilateral pleural effusions unchanged compared to prior chest x-ray of August 22, 2017. Electronically Signed   By: Sherian Rein M.D.   On: 2018/01/06 11:14   US Abdomen Limited Ruq  Result Date: January 06, 2018 CLINICAL DATA:  Elevated liver function tests EXAM: ULTRASOUND ABDOMEN LIMITED RIGHT UPPER QUADRANT COMPARISON:  None. FINDINGS: Gallbladder: 2.3 cm mobile masslike area within the lumen of the gallbladder with no color Doppler flow, consistent with tumefactive sludge. No shadowing gallstones. No wall thickening or focal tenderness  Common bile duct: Diameter: 3 mm Liver: No focal lesion identified. Within normal limits in parenchymal echogenicity. Portal vein is patent on color Doppler imaging with normal direction of blood flow towards the liver. Right pleural effusion, known from prior chest x-ray. IMPRESSION: 1. Tumefactive sludge.  No gallstone or cholecystitis. 2. Right pleural effusion. Electronically Signed   By: Marnee Spring M.D.   On: 01/06/18 13:52     CBC Recent Labs  Lab 2018/01/06 1124 12/17/17 0527  WBC 11.5* 14.4*  HGB 12.2 10.4*  HCT 37.0 32.3*  PLT 152 167  MCV 97.9 97.3  MCH 32.2 31.4  MCHC 32.9 32.3  RDW 19.1* 18.4*    Chemistries  Recent Labs  Lab 01/06/18 1124 12/17/17 0527  NA 143 145  K 4.5 4.5  CL 110 110  CO2 21* 26  GLUCOSE 161* 143*  BUN 37* 40*  CREATININE 0.99 1.30*  CALCIUM 9.5 9.5  AST 51*  --   ALT 22  --   ALKPHOS 77  --   BILITOT 4.5*  --    ------------------------------------------------------------------------------------------------------------------ estimated creatinine clearance is 28.5 mL/min (A) (by C-G formula based on SCr of 1.3 mg/dL (H)). ------------------------------------------------------------------------------------------------------------------ No results for input(s): HGBA1C in the last 72 hours. ------------------------------------------------------------------------------------------------------------------ No results for input(s): CHOL, HDL, LDLCALC, TRIG, CHOLHDL, LDLDIRECT in the last 72 hours. ------------------------------------------------------------------------------------------------------------------ No results for input(s): TSH, T4TOTAL, T3FREE, THYROIDAB in the last 72 hours.  Invalid input(s): FREET3 ------------------------------------------------------------------------------------------------------------------ No results for input(s): VITAMINB12, FOLATE, FERRITIN, TIBC, IRON, RETICCTPCT in the last 72 hours.  Coagulation  profile No results for input(s): INR, PROTIME in the last 168 hours.  No results for input(s): DDIMER in the last 72 hours.  Cardiac Enzymes Recent Labs  Lab January 06, 2018 1124  TROPONINI 0.09*   ------------------------------------------------------------------------------------------------------------------ Invalid input(s): POCBNP    Assessment & Plan   *Acute on chronic systolic congestive heart failure elated to severe aortic stenosis.    Gust with the family will make her comfort care discontinue telemetry transfer for comfort care measures  *Atrial tachycardia with intermittent heart rate being in the 130s.    Comfort measures    *Severe aortic stenosis.    Comfort measures  *Acute encephalopathy.    Suspect due to progressive valvular disease.  *  Jaundice with bilirubin of 4.5.    Due to passive congestion      Code Status Orders  (From admission, onward)         Start     Ordered   12/17/17 1115  Do not attempt resuscitation (DNR)  Continuous    Question Answer Comment  In the event of cardiac or respiratory ARREST Do not call a "code blue"   In the event of cardiac or respiratory ARREST Do not perform Intubation, CPR, defibrillation or ACLS   In the event of cardiac or respiratory ARREST Use medication by any route, position, wound care, and other measures to relive pain and suffering. May use oxygen, suction and manual treatment of airway obstruction as needed for comfort.   Comments RN may pronounce      12/17/17 1114        Code Status History    Date Active Date Inactive Code Status Order ID Comments User Context   2017/06/02 1517 12/17/2017 1114 DNR [id number]  Milagros LollSudini, Srikar, MD ED   08/22/2017 1528 08/26/2017 1735 DNR [id number]  Enid BaasKalisetti, Radhika, MD Inpatient           Consults cardiology  DVT Prophylaxis  Lovenox   Lab Results  Component Value Date   PLT 167 12/17/2017     Time Spent in minutes   35 minutes  Greater than 50% of  time spent in care coordination and counseling patient regarding the condition and plan of care.   Auburn BilberryShreyang Luchiano Viscomi M.D on 12/22/2017 at 2:22 PM  Between 7am to 6pm - Pager - [phone]  After 6pm go to [email or web address] - Social research officer, governmentpassword [email or web address]  [employer]   Office  [phone]

## 2018-01-05 NOTE — Progress Notes (Addendum)
Called to patients room by family member. Upon assessment, patient is not breathing, there is no pulse, and no audible heart sounds. 2nd RN Duwayne HeckDanielle verified that patient had passed away. RN may pronounce specified in code status. MD made aware and states that 2 RN's may pronounce patient. Patient pronounced at 1651. Supervisor notified. Pt's niece in room when patient was pronounced, asked for me to notify her husband. Attempted to call husband with no answer, patients voicemail is not set up. Will continue to notify husband. [facility] will be notified. Otilio JeffersonMadelyn S Fenton, RN   After the 3rd attempt of trying to reach patients husband, pt's husband was notified that the patient had passed away.

## 2018-01-05 NOTE — Progress Notes (Signed)
Called report to 1C nurse for transfer. All questions answered. Will tube any medications. Will transfer patient momentarily. Monitor already removed. Jari FavreSteven M [facility] Jeffersonmhoff

## 2018-01-05 NOTE — Death Summary Note (Signed)
[facility] - [place] at [facility] Date of Admission: 12/19/2017 10:33 AM  Date of death: February 24, 2018   Admitting diagnosis: acute respiratory failure     Diagnosis at time of death* 1.  Acute respiratory failure due to acute on chronic systolic CHF 2.  Severe aortic stenosis 3.  Atrial tachycardia with intermittently elevated heart rate 4.  Acute encephalopathy 5.  Jaundice due to passive hepatic congestion    Hospital course Patient is a 84 year old with history of severe aortic stenosis presented with shortness of breath noted to have acute CHF.  Patient was admitted few months ago and at that time she had chosen not to have any surgery.  Patient was admitted and treated for CHF however her blood pressure started dropping we could not diurese her much.  Further discussions were held with the family and she was made comfort care.  And she passed away.           TOTAL TIME TAKING CARE OF THIS PATIENT:3335minutes.    Auburn BilberryShreyang Carl Butner M.D on 12/10/2017 at 5:08 PM  Between 7am to 6pm - Pager - [phone]  After 6pm go to [email or web address] - password [email or web address]  [employer] Office  [phone]  CC: Primary care physician; Jaclyn Shaggyate, Denny C, MD

## 2018-01-05 DEATH — deceased

## 2018-01-27 ENCOUNTER — Ambulatory Visit: Payer: Medicare Other | Admitting: Family

## 2020-02-27 IMAGING — CR DG CHEST 2V
1 series · 2 of 2 positions shown · non-contrast
Comparison: Chest x-ray from yesterday.

CLINICAL DATA: Shortness of breath and lower extremity swelling.

EXAM:
CHEST - 2 VIEW

[Series 1: dg chest 2 view · 0.14mm/px · 2 of 2 slices shown]
[im 1/2]
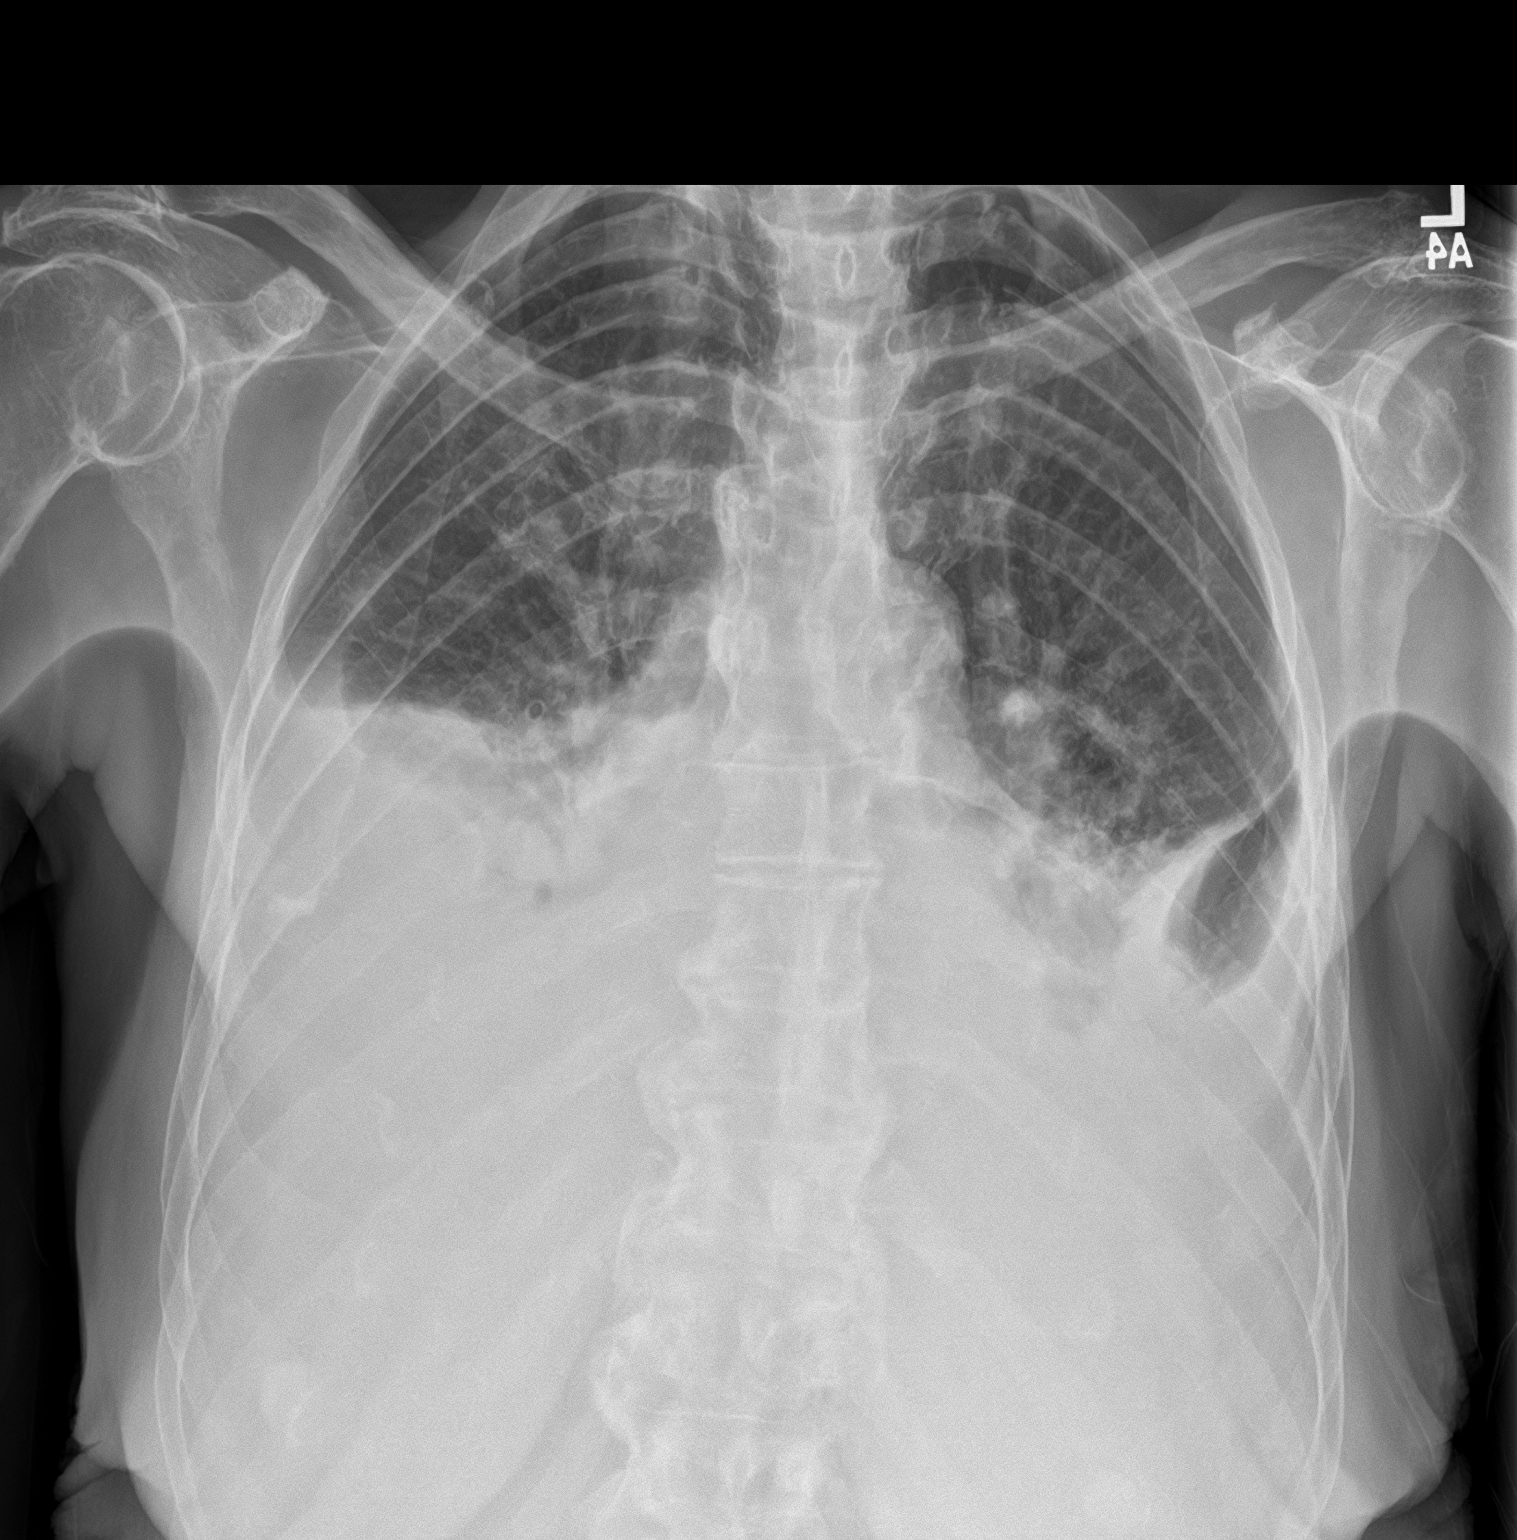
[im 2/2]
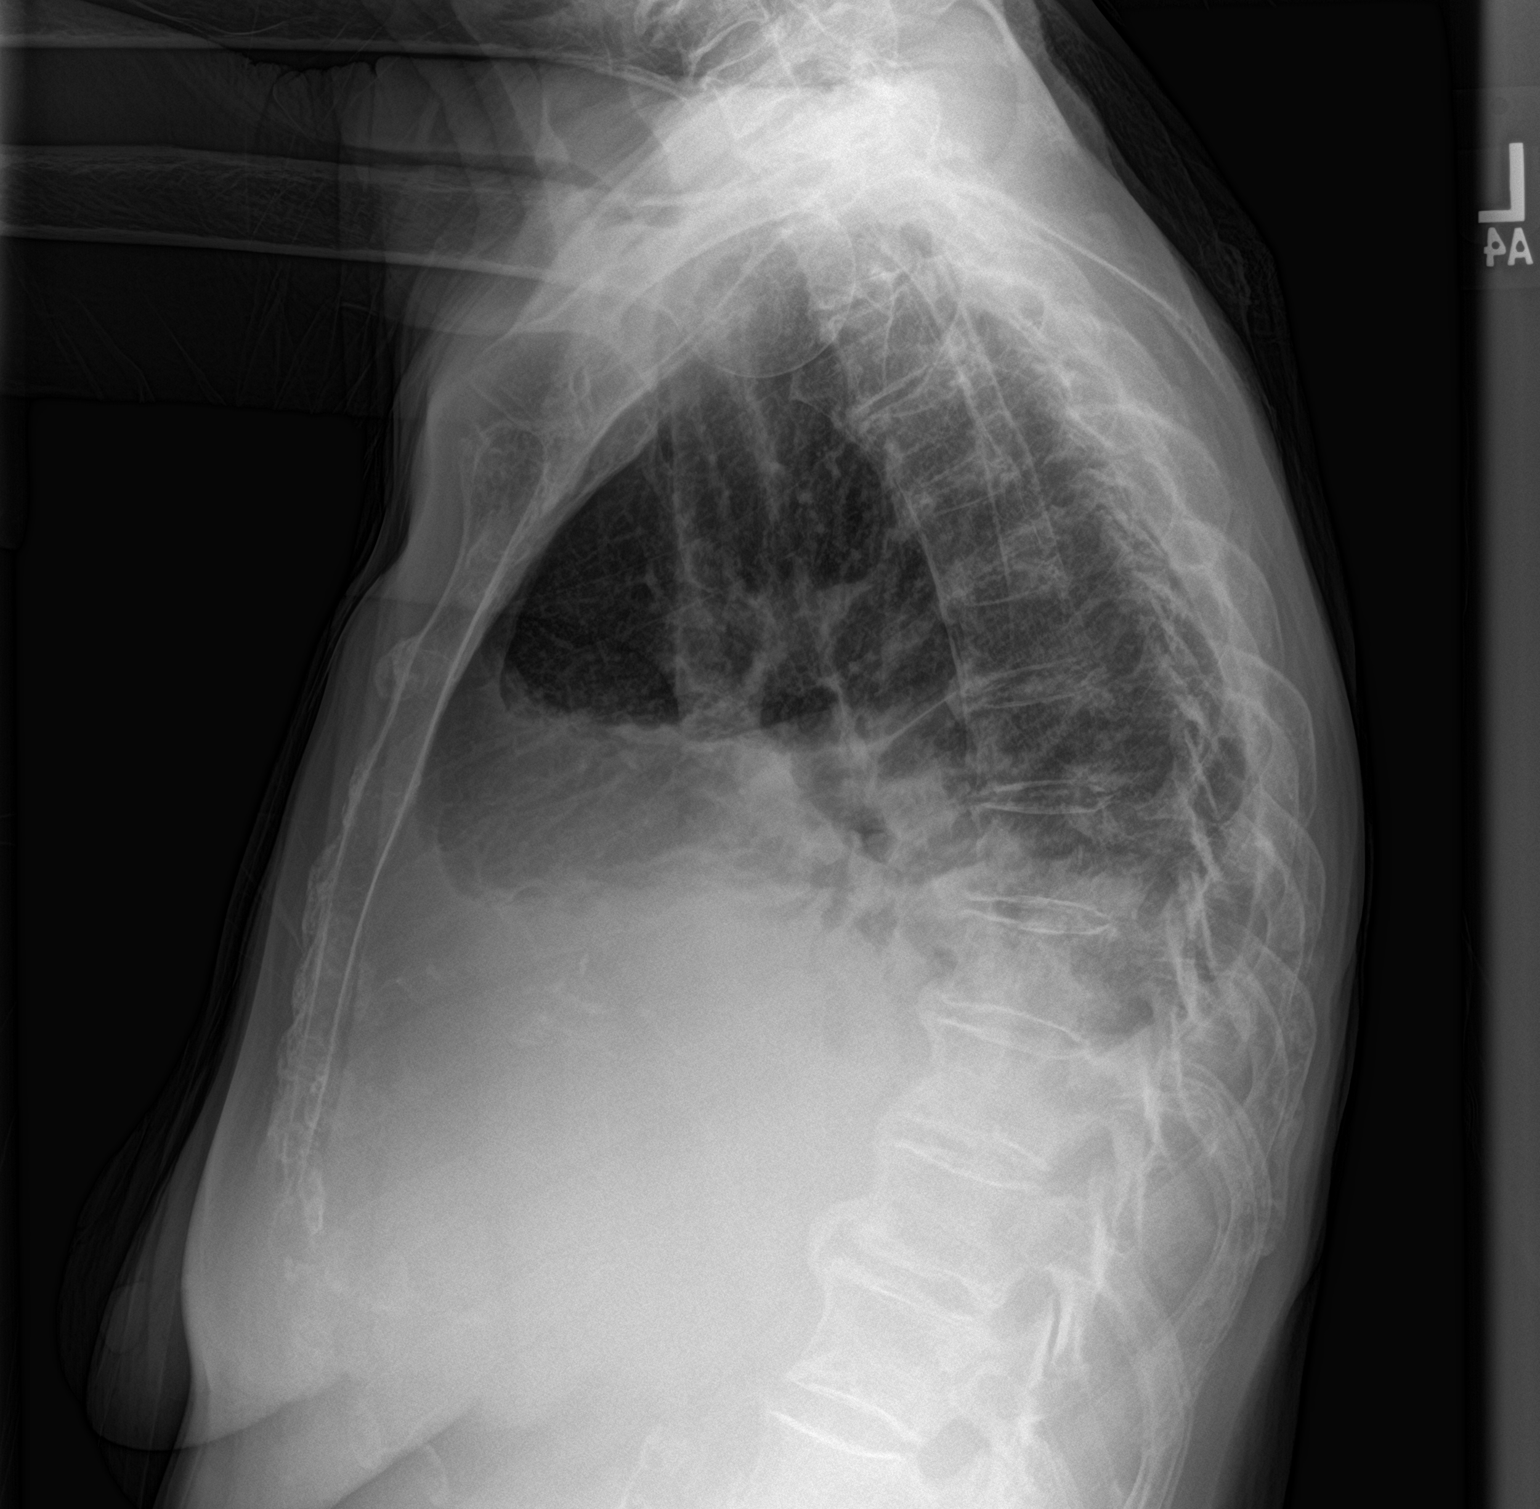

[2 of 2 positions shown; findings below may reference images not displayed]

FINDINGS: The cardiac silhouette is obscured. Pulmonary vascular congestion
with moderate right greater than left pleural effusions, unchanged.
Associated bibasilar atelectasis. No pneumothorax. No acute osseous
abnormality.
IMPRESSION: Unchanged moderate bilateral pleural effusions with lower lobe
atelectasis.
